# Patient Record
Sex: Male | Born: 1960 | Race: White | Hispanic: No | Marital: Married | Smoking: Current every day smoker
Health system: Southern US, Community
[De-identification: ages and names within clinical notes are randomized; demographics above are authoritative.]

## PROBLEM LIST (undated history)

## (undated) DIAGNOSIS — E119 Type 2 diabetes mellitus without complications: Secondary | ICD-10-CM

## (undated) HISTORY — PX: APPENDECTOMY: SHX54

## (undated) HISTORY — PX: PILONIDAL CYST EXCISION: SHX744

---

## 1999-05-11 ENCOUNTER — Inpatient Hospital Stay (HOSPITAL_COMMUNITY): Admission: EM | Admit: 1999-05-11 | Discharge: 1999-05-13 | Payer: Self-pay | Admitting: Emergency Medicine

## 1999-05-11 ENCOUNTER — Encounter: Payer: Self-pay | Admitting: Emergency Medicine

## 2007-06-14 ENCOUNTER — Ambulatory Visit (HOSPITAL_COMMUNITY): Admission: RE | Admit: 2007-06-14 | Discharge: 2007-06-14 | Payer: Self-pay | Admitting: Family Medicine

## 2008-07-18 IMAGING — US US SCROTUM
1 series · 14 of 25 positions shown · non-contrast
Comparison: none

CLINICAL DATA: Two months of possible painless, right scrotal palpable nodules. 
 SCROTAL ULTRASOUND:
TECHNIQUE: Complete ultrasound examination of the testicles, epididymis, and other scrotal structures was performed.

[Series 1: unknown · 0.07mm/px · 14 of 45 slices shown]
[im 1/45]
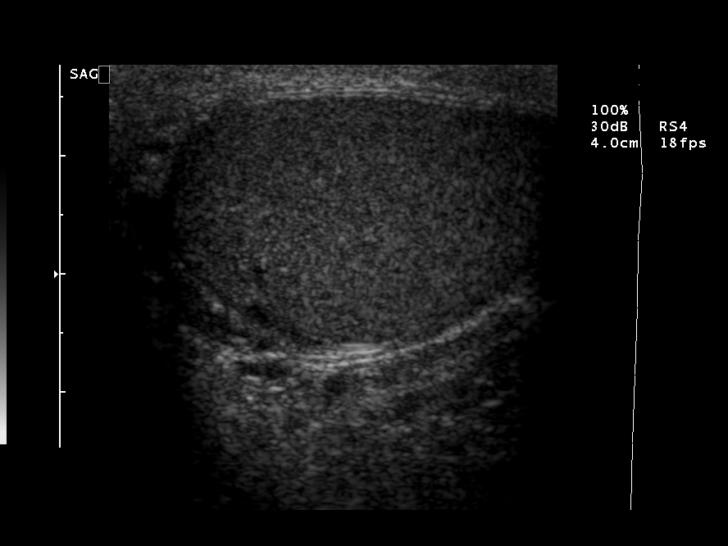
[im 4/45]
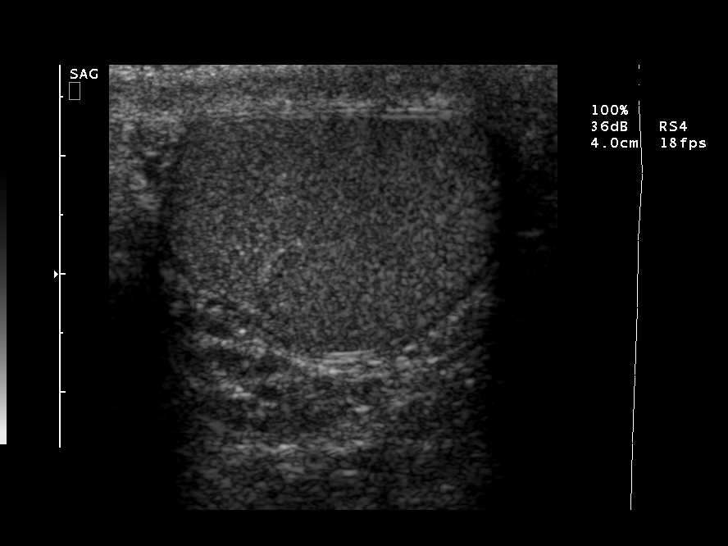
[im 8/45]
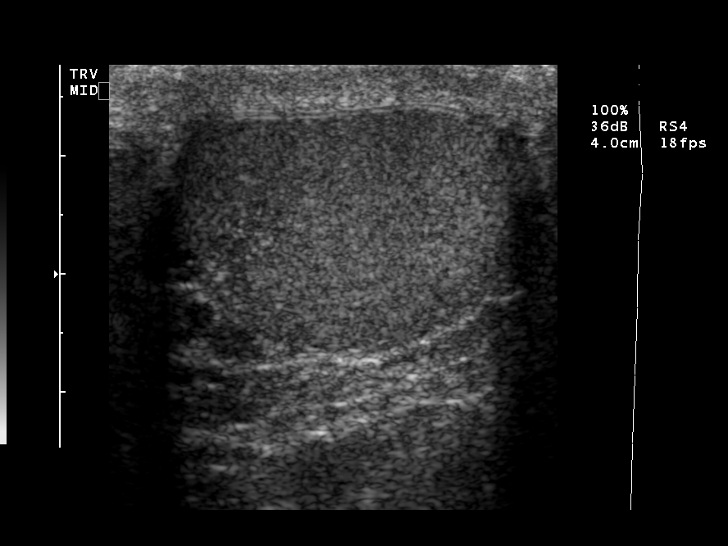
[im 12/45]
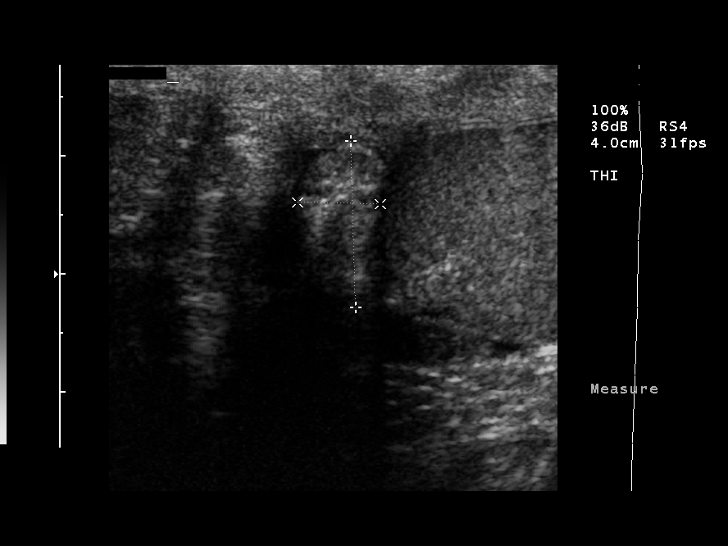
[im 15/45]
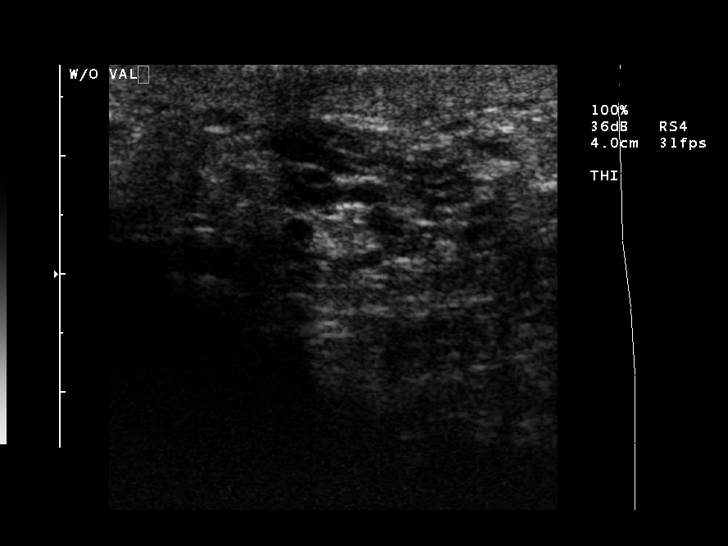
[im 17/45]
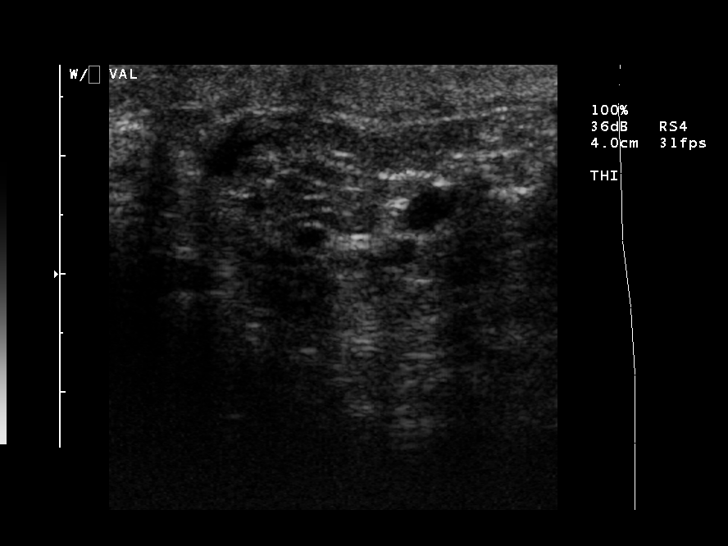
[im 21/45]
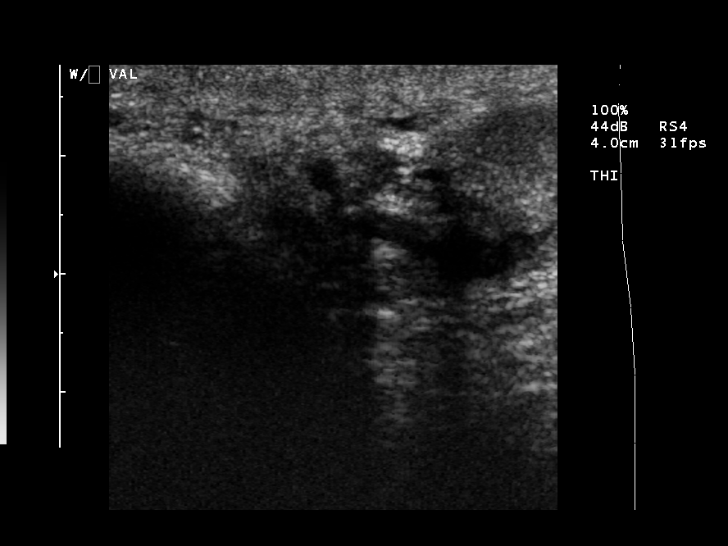
[im 24/45]
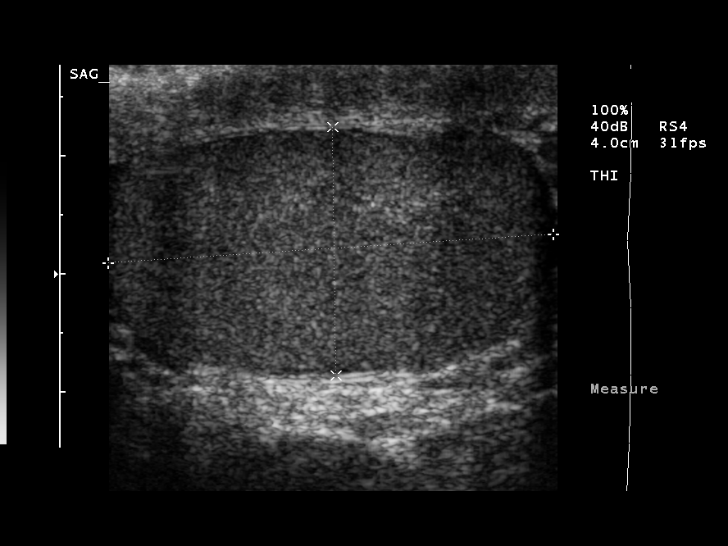
[im 28/45]
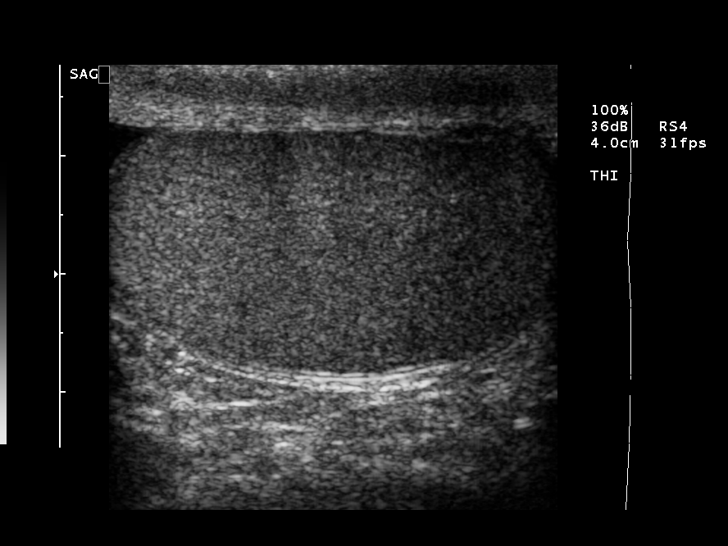
[im 30/45]
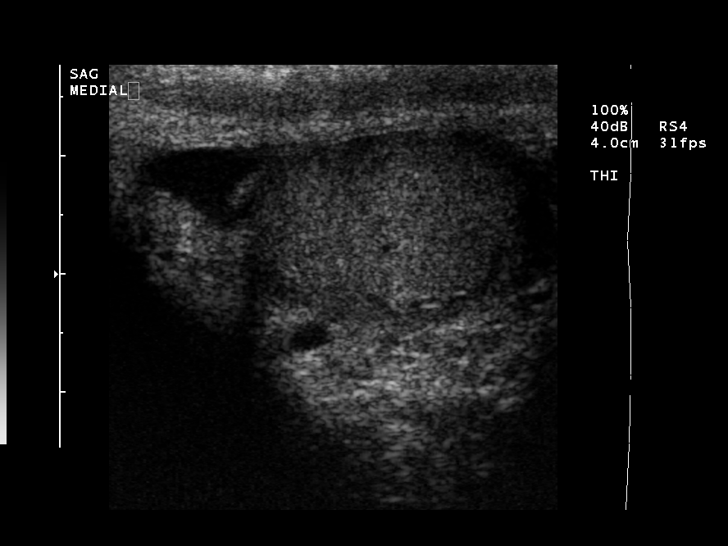
[im 34/45]
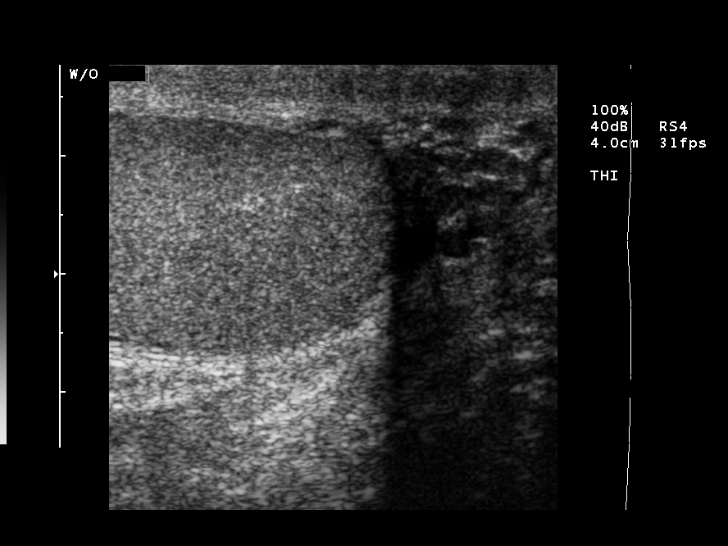
[im 37/45]
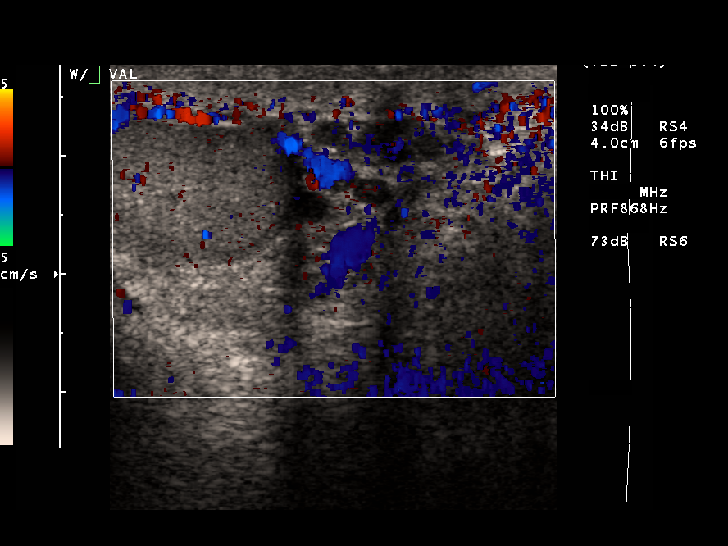
[im 41/45]
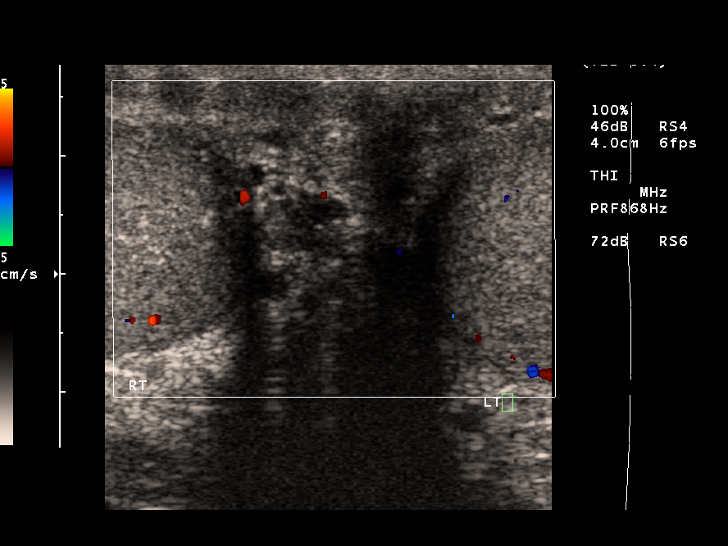
[im 45/45]
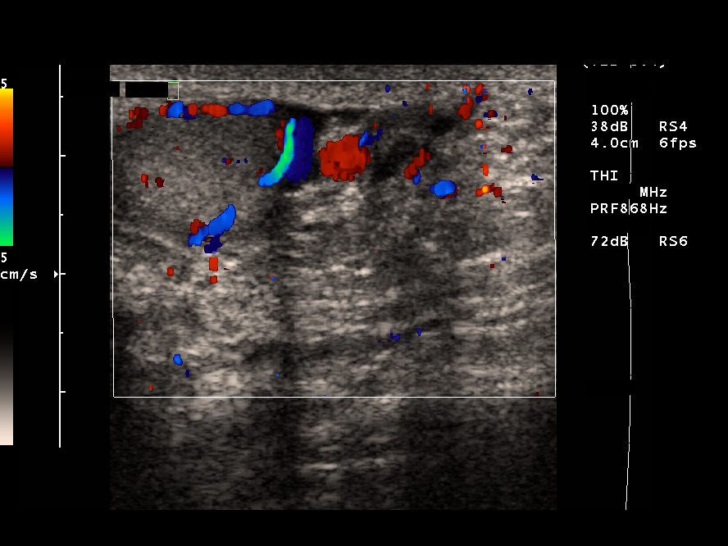

[14 of 25 positions shown; findings below may reference images not displayed]

FINDINGS: Bilateral testes are sonographically normal with the right testis measuring 2.8 cm long x 2 cm AP x 2.8 cm wide and left 3.8 cm long x 2.1 cm AP x 1.8 cm wide.  Bilateral testicular color Doppler flow is symmetrical and normal.  Bilateral epididymal heads are sonographically normal with the right measuring 1.4 cm long x 0.7 cm AP and left 1.3 cm long x 0.8 cm AP.  No hydrocele nor varicocele seen.
IMPRESSION: Negative.

## 2013-01-28 ENCOUNTER — Other Ambulatory Visit: Payer: Self-pay | Admitting: Gastroenterology

## 2017-05-23 DIAGNOSIS — E113299 Type 2 diabetes mellitus with mild nonproliferative diabetic retinopathy without macular edema, unspecified eye: Secondary | ICD-10-CM | POA: Diagnosis not present

## 2017-05-23 DIAGNOSIS — E291 Testicular hypofunction: Secondary | ICD-10-CM | POA: Diagnosis not present

## 2017-05-23 DIAGNOSIS — Z125 Encounter for screening for malignant neoplasm of prostate: Secondary | ICD-10-CM | POA: Diagnosis not present

## 2017-09-12 DIAGNOSIS — E291 Testicular hypofunction: Secondary | ICD-10-CM | POA: Diagnosis not present

## 2017-09-12 DIAGNOSIS — E1165 Type 2 diabetes mellitus with hyperglycemia: Secondary | ICD-10-CM | POA: Diagnosis not present

## 2017-12-13 DIAGNOSIS — E113291 Type 2 diabetes mellitus with mild nonproliferative diabetic retinopathy without macular edema, right eye: Secondary | ICD-10-CM | POA: Diagnosis not present

## 2017-12-13 DIAGNOSIS — E7800 Pure hypercholesterolemia, unspecified: Secondary | ICD-10-CM | POA: Diagnosis not present

## 2017-12-13 DIAGNOSIS — E291 Testicular hypofunction: Secondary | ICD-10-CM | POA: Diagnosis not present

## 2018-05-28 DIAGNOSIS — E11319 Type 2 diabetes mellitus with unspecified diabetic retinopathy without macular edema: Secondary | ICD-10-CM | POA: Diagnosis not present

## 2018-05-28 DIAGNOSIS — Z125 Encounter for screening for malignant neoplasm of prostate: Secondary | ICD-10-CM | POA: Diagnosis not present

## 2018-05-28 DIAGNOSIS — E291 Testicular hypofunction: Secondary | ICD-10-CM | POA: Diagnosis not present

## 2018-11-27 DIAGNOSIS — E113293 Type 2 diabetes mellitus with mild nonproliferative diabetic retinopathy without macular edema, bilateral: Secondary | ICD-10-CM | POA: Diagnosis not present

## 2018-11-27 DIAGNOSIS — E781 Pure hyperglyceridemia: Secondary | ICD-10-CM | POA: Diagnosis not present

## 2018-11-27 DIAGNOSIS — E291 Testicular hypofunction: Secondary | ICD-10-CM | POA: Diagnosis not present

## 2019-02-21 DIAGNOSIS — E119 Type 2 diabetes mellitus without complications: Secondary | ICD-10-CM | POA: Diagnosis not present

## 2023-07-10 ENCOUNTER — Ambulatory Visit (HOSPITAL_BASED_OUTPATIENT_CLINIC_OR_DEPARTMENT_OTHER): Payer: 59 | Admitting: Student

## 2023-07-10 DIAGNOSIS — M25561 Pain in right knee: Secondary | ICD-10-CM | POA: Diagnosis not present

## 2023-07-11 ENCOUNTER — Encounter (HOSPITAL_BASED_OUTPATIENT_CLINIC_OR_DEPARTMENT_OTHER): Payer: Self-pay | Admitting: Student

## 2023-07-11 NOTE — Progress Notes (Signed)
Chief Complaint: Right knee pain     History of Present Illness:    Alvin Hodges is a 62 y.o. male presenting for evaluation today of pain in his right knee.  This began over a month ago when he recalls tweaking his knee while climbing onto a tractor.  Then about 3 weeks ago, he states that he felt something pop in his knee and it has continued to be painful since then.  Pain is located in the medial and posterior knee and is moderate in severity.  Also notes that he has had more frequent popping sensations.  He has been wearing a knee brace as well as taking ibuprofen.  He was seen in orthopedic urgent care a few weeks ago who recommended rest and anti-inflammatories.   Surgical History:   None  PMH/PSH/Family History/Social History/Meds/Allergies:   No past medical history on file. History reviewed. No pertinent surgical history. Social History   Socioeconomic History   Marital status: Married    Spouse name: Not on file   Number of children: Not on file   Years of education: Not on file   Highest education level: Not on file  Occupational History   Not on file  Tobacco Use   Smoking status: Not on file   Smokeless tobacco: Not on file  Substance and Sexual Activity   Alcohol use: Not on file   Drug use: Not on file   Sexual activity: Not on file  Other Topics Concern   Not on file  Social History Narrative   Not on file   Social Determinants of Health   Financial Resource Strain: Not on file  Food Insecurity: Not on file  Transportation Needs: Not on file  Physical Activity: Not on file  Stress: Not on file  Social Connections: Not on file   No family history on file. Not on File No current outpatient medications on file.   No current facility-administered medications for this visit.   No results found.  Review of Systems:   A ROS was performed including pertinent positives and negatives as documented in the  HPI.  Physical Exam :   Constitutional: NAD and appears stated age Neurological: Alert and oriented Psych: Appropriate affect and cooperative There were no vitals taken for this visit.   Comprehensive Musculoskeletal Exam:    Some tenderness noted over the medial joint line.  No significant erythema or effusion present.  Active range of motion from 0 to 110 degrees without crepitus.  No instability with varus or valgus stress.  Positive McMurray's.  Negative Lachman.  Imaging:   Xray review from urgent care on 06/27/23 (right knee 3 views): Mild patellofemoral degenerative changes.  Otherwise negative.    I personally reviewed and interpreted the radiographs.   Assessment:   62 y.o. male with right knee pain.  He has been experiencing some mechanical symptoms and has not gotten significant relief from NSAIDs and RICE therapy.  At this point I do think an MRI is warranted for further evaluation, particularly to evaluate for possible meniscus injury.  I did encourage to continue with these modalities as needed until we can review MRI results and discuss further treatment plan.  Plan :    -Obtain MRI of the right knee and return to clinic to review results with Dr. Steward Drone  I personally saw and evaluated the patient, and participated in the management and treatment plan.  Hazle Nordmann, PA-C Orthopedics  This document was dictated using Conservation officer, historic buildings. A reasonable attempt at proof reading has been made to minimize errors.

## 2023-07-12 NOTE — Addendum Note (Signed)
Addended by: Barbette Or on: 07/12/2023 09:05 AM   Modules accepted: Orders

## 2023-07-18 ENCOUNTER — Ambulatory Visit (INDEPENDENT_AMBULATORY_CARE_PROVIDER_SITE_OTHER): Payer: 59

## 2023-07-18 DIAGNOSIS — M25561 Pain in right knee: Secondary | ICD-10-CM | POA: Diagnosis not present

## 2023-07-20 ENCOUNTER — Ambulatory Visit (HOSPITAL_BASED_OUTPATIENT_CLINIC_OR_DEPARTMENT_OTHER): Payer: 59 | Admitting: Student

## 2023-07-20 DIAGNOSIS — S83241A Other tear of medial meniscus, current injury, right knee, initial encounter: Secondary | ICD-10-CM | POA: Diagnosis not present

## 2023-07-20 NOTE — Progress Notes (Signed)
Chief Complaint: Right knee pain     History of Present Illness:   07/20/23:  Patient presents today for MRI review.  States that he has gotten some relief since last visit.  He is continuing to wear stabilizing knee brace for added support.    07/10/23: Alvin Hodges is a 62 y.o. male presenting for evaluation today of pain in his right knee.  This began over a month ago when he recalls tweaking his knee while climbing onto a tractor.  Then about 3 weeks ago, he states that he felt something pop in his knee and it has continued to be painful since then.  Pain is located in the medial and posterior knee and is moderate in severity.  Also notes that he has had more frequent popping sensations.  He has been wearing a knee brace as well as taking ibuprofen.  He was seen in orthopedic urgent care a few weeks ago who recommended rest and anti-inflammatories.   Surgical History:   None  PMH/PSH/Family History/Social History/Meds/Allergies:   No past medical history on file. No past surgical history on file. Social History   Socioeconomic History   Marital status: Married    Spouse name: Not on file   Number of children: Not on file   Years of education: Not on file   Highest education level: Not on file  Occupational History   Not on file  Tobacco Use   Smoking status: Not on file   Smokeless tobacco: Not on file  Substance and Sexual Activity   Alcohol use: Not on file   Drug use: Not on file   Sexual activity: Not on file  Other Topics Concern   Not on file  Social History Narrative   Not on file   Social Determinants of Health   Financial Resource Strain: Not on file  Food Insecurity: Not on file  Transportation Needs: Not on file  Physical Activity: Not on file  Stress: Not on file  Social Connections: Not on file   No family history on file. Not on File No current outpatient medications on file.   No current facility-administered  medications for this visit.   No results found.  Review of Systems:   A ROS was performed including pertinent positives and negatives as documented in the HPI.  Physical Exam :   Constitutional: NAD and appears stated age Neurological: Alert and oriented Psych: Appropriate affect and cooperative There were no vitals taken for this visit.   Comprehensive Musculoskeletal Exam:    Some tenderness noted over the medial joint line.  No significant erythema or effusion present.  Active range of motion from 0 to 110 degrees without crepitus.  No instability with varus or valgus stress.  Positive McMurray's.  Negative Lachman.  Imaging:   MRI  (right knee): Tear of the posterior medial meniscus involving the meniscal root.    I personally reviewed and interpreted the radiographs.   Assessment:   62 y.o. male with right knee pain after an injury approximately 2 months ago.  MRI reviewed today does show a tear of the posterior horn of the medial meniscus extending into the root.  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  Patient understands  that if not addressed, meniscal root tears can quickly progress osteoarthritis in the knee.  After discussion of knee arthroscopy procedure and recovery protocols, he would like to proceed with surgical correction.  Recommend continuation of knee brace and pain medications as needed in the meantime.  Plan :    -Plan for right knee arthroscopy with meniscal repair with Dr. Steward Drone     I personally saw and evaluated the patient, and participated in the management and treatment plan.  Hazle Nordmann, PA-C Orthopedics  This document was dictated using Conservation officer, historic buildings. A reasonable attempt at proof reading has been made to minimize errors.

## 2023-08-01 ENCOUNTER — Other Ambulatory Visit (HOSPITAL_BASED_OUTPATIENT_CLINIC_OR_DEPARTMENT_OTHER): Payer: Self-pay | Admitting: Orthopaedic Surgery

## 2023-08-01 DIAGNOSIS — S83241A Other tear of medial meniscus, current injury, right knee, initial encounter: Secondary | ICD-10-CM

## 2023-08-22 ENCOUNTER — Encounter (HOSPITAL_BASED_OUTPATIENT_CLINIC_OR_DEPARTMENT_OTHER): Payer: Self-pay | Admitting: Orthopaedic Surgery

## 2023-08-22 ENCOUNTER — Other Ambulatory Visit: Payer: Self-pay

## 2023-08-22 NOTE — Progress Notes (Signed)
Updated labs in Care Everywhere 07/27/23. Ok by Dr. Malen Gauze to not repeat labs

## 2023-08-29 ENCOUNTER — Encounter (HOSPITAL_BASED_OUTPATIENT_CLINIC_OR_DEPARTMENT_OTHER)
Admission: RE | Disposition: A | Discharge status: Home / Self Care | Payer: Self-pay | Source: Home / Self Care | Attending: Orthopaedic Surgery

## 2023-08-29 ENCOUNTER — Encounter (HOSPITAL_BASED_OUTPATIENT_CLINIC_OR_DEPARTMENT_OTHER): Payer: Self-pay | Admitting: Orthopaedic Surgery

## 2023-08-29 ENCOUNTER — Ambulatory Visit (HOSPITAL_BASED_OUTPATIENT_CLINIC_OR_DEPARTMENT_OTHER): Payer: 59 | Admitting: Certified Registered"

## 2023-08-29 ENCOUNTER — Other Ambulatory Visit: Payer: Self-pay

## 2023-08-29 ENCOUNTER — Ambulatory Visit (HOSPITAL_BASED_OUTPATIENT_CLINIC_OR_DEPARTMENT_OTHER)
Admission: RE | Admit: 2023-08-29 | Discharge: 2023-08-29 | Disposition: A | Discharge status: Home / Self Care | Payer: 59 | Attending: Orthopaedic Surgery | Admitting: Orthopaedic Surgery

## 2023-08-29 DIAGNOSIS — S83241A Other tear of medial meniscus, current injury, right knee, initial encounter: Secondary | ICD-10-CM | POA: Insufficient documentation

## 2023-08-29 DIAGNOSIS — E119 Type 2 diabetes mellitus without complications: Secondary | ICD-10-CM | POA: Diagnosis not present

## 2023-08-29 DIAGNOSIS — Y9339 Activity, other involving climbing, rappelling and jumping off: Secondary | ICD-10-CM | POA: Diagnosis not present

## 2023-08-29 DIAGNOSIS — F17200 Nicotine dependence, unspecified, uncomplicated: Secondary | ICD-10-CM | POA: Diagnosis not present

## 2023-08-29 HISTORY — DX: Type 2 diabetes mellitus without complications: E11.9

## 2023-08-29 HISTORY — PX: KNEE ARTHROSCOPY WITH MENISCAL REPAIR: SHX5653

## 2023-08-29 LAB — GLUCOSE, CAPILLARY
Glucose-Capillary: 124 mg/dL — ABNORMAL HIGH (ref 70–99)
Glucose-Capillary: 136 mg/dL — ABNORMAL HIGH (ref 70–99)

## 2023-08-29 SURGERY — ARTHROSCOPY, KNEE, WITH MENISCUS REPAIR
Anesthesia: General | Site: Knee | Laterality: Right

## 2023-08-29 MED ORDER — ROPIVACAINE HCL 7.5 MG/ML IJ SOLN
INTRAMUSCULAR | Status: DC | PRN
Start: 2023-08-29 — End: 2023-08-29
  Administered 2023-08-29: 20 mL via PERINEURAL

## 2023-08-29 MED ORDER — LIDOCAINE 2% (20 MG/ML) 5 ML SYRINGE
INTRAMUSCULAR | Status: AC
  Filled 2023-08-29: qty 5

## 2023-08-29 MED ORDER — PROPOFOL 10 MG/ML IV BOLUS
INTRAVENOUS | Status: AC
  Filled 2023-08-29: qty 20

## 2023-08-29 MED ORDER — CEFAZOLIN SODIUM-DEXTROSE 2-3 GM-%(50ML) IV SOLR
INTRAVENOUS | Status: DC | PRN
Start: 2023-08-29 — End: 2023-08-29
  Administered 2023-08-29: 2 g via INTRAVENOUS

## 2023-08-29 MED ORDER — MIDAZOLAM HCL 2 MG/2ML IJ SOLN
INTRAMUSCULAR | Status: AC
  Filled 2023-08-29: qty 2

## 2023-08-29 MED ORDER — FENTANYL CITRATE (PF) 100 MCG/2ML IJ SOLN
INTRAMUSCULAR | Status: AC
  Filled 2023-08-29: qty 2

## 2023-08-29 MED ORDER — PROPOFOL 10 MG/ML IV BOLUS
INTRAVENOUS | Status: DC | PRN
  Administered 2023-08-29: 200 mg via INTRAVENOUS

## 2023-08-29 MED ORDER — FENTANYL CITRATE (PF) 100 MCG/2ML IJ SOLN
50.0000 ug | Freq: Once | INTRAMUSCULAR | Status: AC
  Administered 2023-08-29: 50 ug via INTRAVENOUS

## 2023-08-29 MED ORDER — ONDANSETRON HCL 4 MG/2ML IJ SOLN
INTRAMUSCULAR | Status: AC
  Filled 2023-08-29: qty 2

## 2023-08-29 MED ORDER — ONDANSETRON HCL 4 MG/2ML IJ SOLN
INTRAMUSCULAR | Status: DC | PRN
  Administered 2023-08-29: 4 mg via INTRAVENOUS

## 2023-08-29 MED ORDER — LIDOCAINE HCL (CARDIAC) PF 100 MG/5ML IV SOSY
PREFILLED_SYRINGE | INTRAVENOUS | Status: DC | PRN
  Administered 2023-08-29: 100 mg via INTRAVENOUS

## 2023-08-29 MED ORDER — DEXAMETHASONE SODIUM PHOSPHATE 10 MG/ML IJ SOLN
INTRAMUSCULAR | Status: AC
  Filled 2023-08-29: qty 1

## 2023-08-29 MED ORDER — LACTATED RINGERS IV SOLN: INTRAVENOUS | Status: DC

## 2023-08-29 MED ORDER — ACETAMINOPHEN 500 MG PO TABS: 500.0000 mg | ORAL_TABLET | Freq: Three times a day (TID) | ORAL | 0 refills | Status: AC

## 2023-08-29 MED ORDER — IBUPROFEN 800 MG PO TABS: 800.0000 mg | ORAL_TABLET | Freq: Three times a day (TID) | ORAL | 0 refills | Status: AC

## 2023-08-29 MED ORDER — DEXAMETHASONE SODIUM PHOSPHATE 10 MG/ML IJ SOLN
INTRAMUSCULAR | Status: DC | PRN
  Administered 2023-08-29: 4 mg via INTRAVENOUS

## 2023-08-29 MED ORDER — MIDAZOLAM HCL 2 MG/2ML IJ SOLN
1.0000 mg | Freq: Once | INTRAMUSCULAR | Status: AC
  Administered 2023-08-29: 1 mg via INTRAVENOUS

## 2023-08-29 MED ORDER — CEFAZOLIN SODIUM 1 G IJ SOLR
INTRAMUSCULAR | Status: AC
  Filled 2023-08-29: qty 20

## 2023-08-29 MED ORDER — SODIUM CHLORIDE 0.9 % IR SOLN
Status: DC | PRN
  Administered 2023-08-29: 9000 mL

## 2023-08-29 MED ORDER — ASPIRIN 325 MG PO TBEC: 325.0000 mg | DELAYED_RELEASE_TABLET | Freq: Every day | ORAL | 0 refills | Status: AC

## 2023-08-29 MED ORDER — SODIUM CHLORIDE (PF) 0.9 % IJ SOLN
INTRAMUSCULAR | Status: AC
  Filled 2023-08-29: qty 20

## 2023-08-29 MED ORDER — OXYCODONE HCL 5 MG PO TABS
5.0000 mg | ORAL_TABLET | ORAL | 0 refills | Status: AC | PRN
Start: 2023-08-29 — End: ?

## 2023-08-29 MED ORDER — FENTANYL CITRATE (PF) 100 MCG/2ML IJ SOLN
INTRAMUSCULAR | Status: DC | PRN
  Administered 2023-08-29 (×6): 25 ug via INTRAVENOUS

## 2023-08-29 SURGICAL SUPPLY — 63 items
APL PRP STRL LF DISP 70% ISPRP (MISCELLANEOUS) ×1
BANDAGE ESMARK 6X9 LF (GAUZE/BANDAGES/DRESSINGS) IMPLANT
BLADE EXCALIBUR 4.0X13 (MISCELLANEOUS) IMPLANT
BLADE SURG 15 STRL LF DISP TIS (BLADE) IMPLANT
BLADE SURG 15 STRL SS (BLADE) ×1
BNDG CMPR 5X4 KNIT ELC UNQ LF (GAUZE/BANDAGES/DRESSINGS) ×1
BNDG CMPR 6 X 5 YARDS HK CLSR (GAUZE/BANDAGES/DRESSINGS) ×1
BNDG CMPR 9X6 STRL LF SNTH (GAUZE/BANDAGES/DRESSINGS)
BNDG ELASTIC 4INX 5YD STR LF (GAUZE/BANDAGES/DRESSINGS) ×2 IMPLANT
BNDG ELASTIC 6INX 5YD STR LF (GAUZE/BANDAGES/DRESSINGS) ×2 IMPLANT
BNDG ESMARK 6X9 LF (GAUZE/BANDAGES/DRESSINGS)
CHLORAPREP W/TINT 26 (MISCELLANEOUS) ×2 IMPLANT
COOLER ICEMAN CLASSIC (MISCELLANEOUS) ×2 IMPLANT
CUFF TOURN SGL QUICK 34 (TOURNIQUET CUFF)
CUFF TRNQT CYL 34X4.125X (TOURNIQUET CUFF) IMPLANT
DISSECTOR 3.8MM X 13CM (MISCELLANEOUS) ×2 IMPLANT
DRAPE ARTHROSCOPY W/POUCH 90 (DRAPES) ×2 IMPLANT
DRAPE IMP U-DRAPE 54X76 (DRAPES) ×2 IMPLANT
DRAPE INCISE IOBAN 66X45 STRL (DRAPES) IMPLANT
DRAPE U-SHAPE 47X51 STRL (DRAPES) ×2 IMPLANT
DW OUTFLOW CASSETTE/TUBE SET (MISCELLANEOUS) ×2 IMPLANT
ELECT REM PT RETURN 9FT ADLT (ELECTROSURGICAL) ×1
ELECTRODE REM PT RTRN 9FT ADLT (ELECTROSURGICAL) IMPLANT
EXCALIBUR 3.8MM X 13CM (MISCELLANEOUS) IMPLANT
GAUZE 4X4 16PLY ~~LOC~~+RFID DBL (SPONGE) IMPLANT
GAUZE PAD ABD 8X10 STRL (GAUZE/BANDAGES/DRESSINGS) ×2 IMPLANT
GAUZE SPONGE 4X4 12PLY STRL (GAUZE/BANDAGES/DRESSINGS) ×2 IMPLANT
GAUZE XEROFORM 1X8 LF (GAUZE/BANDAGES/DRESSINGS) ×2 IMPLANT
GLOVE BIO SURGEON STRL SZ 6 (GLOVE) ×2 IMPLANT
GLOVE BIO SURGEON STRL SZ7.5 (GLOVE) ×2 IMPLANT
GLOVE BIOGEL PI IND STRL 6.5 (GLOVE) ×2 IMPLANT
GLOVE BIOGEL PI IND STRL 8 (GLOVE) ×2 IMPLANT
GOWN STRL REUS W/ TWL LRG LVL3 (GOWN DISPOSABLE) ×2 IMPLANT
GOWN STRL REUS W/ TWL XL LVL3 (GOWN DISPOSABLE) ×2 IMPLANT
GOWN STRL REUS W/TWL LRG LVL3 (GOWN DISPOSABLE) ×1
GOWN STRL REUS W/TWL XL LVL3 (GOWN DISPOSABLE) ×2
IMMOBILIZER KNEE 24 THIGH 36 (MISCELLANEOUS) IMPLANT
IMMOBILIZER KNEE 24 UNIV (MISCELLANEOUS) ×1
KIT ROOT REPAIR MEINISCAL PEEK (Anchor) IMPLANT
LOOP 2 FIBERLINK CLOSED (SUTURE) IMPLANT
MANIFOLD NEPTUNE II (INSTRUMENTS) ×2 IMPLANT
MEINISCAL ROOT REPAIR KIT PEEK (Anchor) ×1 IMPLANT
NDL HYPO 18GX1.5 BLUNT FILL (NEEDLE) ×2 IMPLANT
NDL SAFETY ECLIP 18X1.5 (MISCELLANEOUS) ×2 IMPLANT
NDL SUT 2-0 SCORPION KNEE (NEEDLE) IMPLANT
NEEDLE HYPO 18GX1.5 BLUNT FILL (NEEDLE) ×1 IMPLANT
NEEDLE SUT 2-0 SCORPION KNEE (NEEDLE) IMPLANT
PACK ARTHROSCOPY DSU (CUSTOM PROCEDURE TRAY) ×2 IMPLANT
PACK BASIN DAY SURGERY FS (CUSTOM PROCEDURE TRAY) ×2 IMPLANT
PAD COLD SHLDR WRAP-ON (PAD) ×2 IMPLANT
PADDING CAST COTTON 6X4 STRL (CAST SUPPLIES) ×2 IMPLANT
PENCIL SMOKE EVACUATOR (MISCELLANEOUS) IMPLANT
PORT APPOLLO RF 90DEGREE MULTI (SURGICAL WAND) ×2 IMPLANT
SLEEVE SCD COMPRESS KNEE MED (STOCKING) ×2 IMPLANT
SUCTION TUBE FRAZIER 10FR DISP (SUCTIONS) IMPLANT
SUT ETHILON 3 0 PS 1 (SUTURE) ×2 IMPLANT
SUT VIC AB 2-0 SH 27 (SUTURE) ×1
SUT VIC AB 2-0 SH 27XBRD (SUTURE) IMPLANT
SUT VIC AB 3-0 FS2 27 (SUTURE) IMPLANT
SYR 5ML LL (SYRINGE) ×2 IMPLANT
TOWEL GREEN STERILE FF (TOWEL DISPOSABLE) ×4 IMPLANT
TRAY DSU PREP LF (CUSTOM PROCEDURE TRAY) ×2 IMPLANT
TUBING ARTHROSCOPY IRRIG 16FT (MISCELLANEOUS) ×2 IMPLANT

## 2023-08-29 NOTE — Anesthesia Preprocedure Evaluation (Signed)
Anesthesia Evaluation  Patient identified by MRN, date of birth, ID band Patient awake    Reviewed: Allergy & Precautions, H&P , NPO status , Patient's Chart, lab work & pertinent test results  Airway Mallampati: II  TM Distance: >3 FB Neck ROM: Full    Dental no notable dental hx.    Pulmonary Current Smoker and Patient abstained from smoking.   Pulmonary exam normal breath sounds clear to auscultation       Cardiovascular negative cardio ROS Normal cardiovascular exam Rhythm:Regular Rate:Normal     Neuro/Psych negative neurological ROS  negative psych ROS   GI/Hepatic negative GI ROS, Neg liver ROS,,,  Endo/Other  diabetes, Well Controlled, Type 2    Renal/GU negative Renal ROS  negative genitourinary   Musculoskeletal negative musculoskeletal ROS (+)    Abdominal   Peds negative pediatric ROS (+)  Hematology negative hematology ROS (+)   Anesthesia Other Findings   Reproductive/Obstetrics negative OB ROS                             Anesthesia Physical Anesthesia Plan  ASA: 2  Anesthesia Plan: General   Post-op Pain Management: Toradol IV (intra-op)*   Induction: Intravenous  PONV Risk Score and Plan: 1 and Ondansetron, Dexamethasone and Treatment may vary due to age or medical condition  Airway Management Planned: LMA  Additional Equipment:   Intra-op Plan:   Post-operative Plan: Extubation in OR  Informed Consent: I have reviewed the patients History and Physical, chart, labs and discussed the procedure including the risks, benefits and alternatives for the proposed anesthesia with the patient or authorized representative who has indicated his/her understanding and acceptance.     Dental advisory given  Plan Discussed with: CRNA and Surgeon  Anesthesia Plan Comments:        Anesthesia Quick Evaluation

## 2023-08-29 NOTE — Brief Op Note (Signed)
   Brief Op Note  Date of Surgery: 08/29/2023  Preoperative Diagnosis: RIGHT KNEE MEDIAL MENISCUS TEAR  Postoperative Diagnosis: same  Procedure: Procedure(s): RIGHT KNEE ARTHROSCOPY WITH MEDIAL  MENISCAL REPAIR  Implants: Implant Name Type Inv. Item Serial No. Manufacturer Lot No. LRB No. Used Action  MEINISCAL ROOT REPAIR KIT PEEK - [id number] Anchor MEINISCAL ROOT REPAIR KIT PEEK  [employer] [id number] Right 1 Implanted    Surgeons: Surgeon(s): Huel Cote, MD  Anesthesia: General    Estimated Blood Loss: See anesthesia record  Complications: None  Condition to PACU: Stable  Benancio Deeds, MD 08/29/2023 12:27 PM

## 2023-08-29 NOTE — Op Note (Signed)
Date of Surgery: 08/29/2023  INDICATIONS: Mr. Birtcher is a 62 y.o.-year-old male with right medial meniscal root tear.  The risk and benefits of the procedure were discussed in detail and documented in the pre-operative evaluation.   PREOPERATIVE DIAGNOSIS: 1. Right knee medial meniscal root tear  POSTOPERATIVE DIAGNOSIS: Same.  PROCEDURE: 1. Right knee medial meniscal root repair  SURGEON: Benancio Deeds MD  ASSISTANT: Kerby Less, ATC  ANESTHESIA:  general  IV FLUIDS AND URINE: See anesthesia record.  ANTIBIOTICS: Ancef  ESTIMATED BLOOD LOSS: 15 mL.  IMPLANTS:  Implant Name Type Inv. Item Serial No. Manufacturer Lot No. LRB No. Used Action  MEINISCAL ROOT REPAIR KIT PEEK - [id number] Anchor MEINISCAL ROOT REPAIR KIT PEEK  [employer] [id number] Right 1 Implanted    DRAINS: None  CULTURES: None  COMPLICATIONS: none  DESCRIPTION OF PROCEDURE:  Examination under anesthesia: A careful examination under anesthesia was performed.  Knee ROM motion was: -3 - 135 Lachman: Normal Pivot Shift: Normal Posterior drawer: normal.   Varus stability in full extension: normal.   Varus stability in 30 degrees of flexion: normal.  Valgus stability in full extension: normal.   Valgus stability in 30 degrees of flexion: normal.  Posterolateral drawer: normal   Intra-operative findings: A thorough arthroscopic examination of the knee was performed.  The findings are: 1. Suprapatellar pouch: Normal 2. Undersurface of median ridge: Normal 3. Medial patellar facet: Normal 4. Lateral patellar facet: Normal 5. Trochlea: Normal 6. Lateral gutter/popliteus tendon: Normal 7. Hoffa's fat pad: Normal 8. Medial gutter/plica: Normal 9. ACL: Normal 10. PCL: Normal 11. Medial meniscus: Meniscal root tear, complete 12. Medial compartment cartilage: Grade 2 cartilage fissuring diffusely tibia/femur 13. Lateral meniscus: Normal 14. Lateral compartment cartilage: Normal  I identified the  patient in the pre-operative holding area.  I marked the operative knee with my initials. I reviewed the risks and benefits of the proposed surgical intervention and the patient wished to proceed.  Anesthesia performed a peripheral nerve block.  Patient was subsequently taken back to the operating room.  The patient was transferred to the operative suite and placed in the supine position with all bony prominences padded.     SCDs were placed on the non-operative lower extremity. Appropriate antibiotics was administered within 1 hour before incision. The operative lower extremity was then prepped and draped in standard fashion. A time out was performed confirming the correct extremity, correct patient and correct procedure.    A standard anterolateral portal was made with an 11 blade.  The ideal position for the anteromedial portal was established using a spinal needle.  This AM portal was then created under direct visualization with an 11 blade.  A full diagnostic arthroscopy was then performed, as described above, including probing of the chondral and meniscal surfaces.     The meniscus root repair was then performed.  A knee scorpion from [employer] was then used to place two 0 non-absorbable sutures through the torn posterior horn meniscus 1.5 cm medial to the posterior root. The 2 free ends of the suture were passed through the loop, creating a locking stitch/luggage tag.   A tibial tunnel was created at the media meniscal root attachment using an ACL guide. Once the guide was positioned intra-articularly at the center of the meniscal root, the [employer] retrocutter was introduced up through the footprint of the root.  The sleeve was malleted into place and a fiber stick was then placed.  The sutures were retrieved through the medial  portal.  These were used to pass the sutures in the root.  Care was taken to ensure that no suture bridge had formed.  The sutures of the meniscal root were then inserted into  the tibia using a 4.75 mm swivel lock.  This was done by first drilling into the tibial bone and subsequently tapping the bone.  The sutures were tensioned and the anchor was placed.  Arthroscopy confirmed anatomic reduction of the root of the meniscus with good tension.  That concluded the case.  Skin was closed with 2-0 Vicryl and 3-0 nylon. Xeroform gauze, gauze, Tegaderm, Iceman and brace were applied.  Instrument, sponge, and needle counts were correct prior to wound closure and at the conclusion of the case.  The patient was taken to the PACU without complication     POSTOPERATIVE PLAN: He will be touchdown weightbearing for 2 weeks. He will be seen by PT postop. He will be transitioned from knee immobilizer to hinged brace at first PT apptointment  Benancio Deeds, MD 12:28 PM

## 2023-08-29 NOTE — Interval H&P Note (Signed)
History and Physical Interval Note:  08/29/2023 10:38 AM  Alvin Hodges  has presented today for surgery, with the diagnosis of RIGHT KNEE MEDIAL MENISCUS TEAR.  The various methods of treatment have been discussed with the patient and family. After consideration of risks, benefits and other options for treatment, the patient has consented to  Procedure(s): RIGHT KNEE ARTHROSCOPY WITH MEDIAL  MENISCAL REPAIR (Right) as a surgical intervention.  The patient's history has been reviewed, patient examined, no change in status, stable for surgery.  I have reviewed the patient's chart and labs.  Questions were answered to the patient's satisfaction.     Huel Cote

## 2023-08-29 NOTE — H&P (Signed)
Chief Complaint: Right knee pain        History of Present Illness:    07/20/23:  Patient presents today for MRI review.  States that he has gotten some relief since last visit.  He is continuing to wear stabilizing knee brace for added support.       07/10/23: Alvin Hodges is a 62 y.o. male presenting for evaluation today of pain in his right knee.  This began over a month ago when he recalls tweaking his knee while climbing onto a tractor.  Then about 3 weeks ago, he states that he felt something pop in his knee and it has continued to be painful since then.  Pain is located in the medial and posterior knee and is moderate in severity.  Also notes that he has had more frequent popping sensations.  He has been wearing a knee brace as well as taking ibuprofen.  He was seen in orthopedic urgent care a few weeks ago who recommended rest and anti-inflammatories.     Surgical History:   None   PMH/PSH/Family History/Social History/Meds/Allergies:   No past medical history on file.     No past surgical history on file.     Social History         Socioeconomic History   Marital status: Married      Spouse name: Not on file   Number of children: Not on file   Years of education: Not on file   Highest education level: Not on file  Occupational History   Not on file  Tobacco Use   Smoking status: Not on file   Smokeless tobacco: Not on file  Substance and Sexual Activity   Alcohol use: Not on file   Drug use: Not on file   Sexual activity: Not on file  Other Topics Concern   Not on file  Social History Narrative   Not on file    Social Determinants of Health    Financial Resource Strain: Not on file  Food Insecurity: Not on file  Transportation Needs: Not on file  Physical Activity: Not on file  Stress: Not on file  Social Connections: Not on file    No family history on file.     Allergies  Not on File   No current outpatient  medications on file.      No current facility-administered medications for this visit.      Imaging Results (Last 48 hours)  No results found.     Review of Systems:   A ROS was performed including pertinent positives and negatives as documented in the HPI.   Physical Exam :   Constitutional: NAD and appears stated age Neurological: Alert and oriented Psych: Appropriate affect and cooperative There were no vitals taken for this visit.    Comprehensive Musculoskeletal Exam:     Some tenderness noted over the medial joint line.  No significant erythema or effusion present.  Active range of motion from 0 to 110 degrees without crepitus.  No instability with varus or valgus stress.  Positive McMurray's.  Negative Lachman.   Imaging:   MRI  (right knee): Tear of the posterior medial meniscus involving the meniscal root.       I personally reviewed and interpreted the radiographs.     Assessment:  62 y.o. male with right knee pain after an injury approximately 2 months ago.  MRI reviewed today does show a tear of the posterior horn of the medial meniscus extending into the root.  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  Patient understands that if not addressed, meniscal root tears can quickly progress osteoarthritis in the knee.  After discussion of knee arthroscopy procedure and recovery protocols, he would like to proceed with surgical correction.  Recommend continuation of knee brace and pain medications as needed in the meantime.   Plan :     -Plan for right knee arthroscopy with meniscal root repair   After a lengthy discussion of treatment options, including risks, benefits, alternatives, complications of surgical and nonsurgical conservative options, the patient elected surgical repair.   The patient  is aware of the material risks  and complications including, but not limited to injury to  adjacent structures, neurovascular injury, infection, numbness, bleeding, implant failure, thermal burns, stiffness, persistent pain, failure to heal, disease transmission from allograft, need for further surgery, dislocation, anesthetic risks, blood clots, risks of death,and others. The probabilities of surgical success and failure discussed with patient given their particular co-morbidities.The time and nature of expected rehabilitation and recovery was discussed.The patient's questions were all answered preoperatively.  No barriers to understanding were noted. I explained the natural history of the disease process and Rx rationale.  I explained to the patient what I considered to be reasonable expectations given their personal situation.  The final treatment plan was arrived at through a shared patient decision making process model.          I personally saw and evaluated the patient, and participated in the management and treatment plan.

## 2023-08-29 NOTE — Progress Notes (Signed)
Assisted Dr. Rose with right, adductor canal, ultrasound guided block. Side rails up, monitors on throughout procedure. See vital signs in flow sheet. Tolerated Procedure well. 

## 2023-08-29 NOTE — Anesthesia Procedure Notes (Signed)
Anesthesia Regional Block: Adductor canal block   Pre-Anesthetic Checklist: , timeout performed,  Correct Patient, Correct Site, Correct Laterality,  Correct Procedure, Correct Position, site marked,  Risks and benefits discussed,  Surgical consent,  Pre-op evaluation,  At surgeon's request and post-op pain management  Laterality: Right  Prep: chloraprep       Needles:  Injection technique: Single-shot  Needle Type: Echogenic Needle     Needle Length: 9cm      Additional Needles:   Procedures:,,,, ultrasound used (permanent image in chart),,    Narrative:  Start time: 08/29/2023 10:47 AM End time: 08/29/2023 10:55 AM Injection made incrementally with aspirations every 5 mL.  Performed by: Personally  Anesthesiologist: Eilene Ghazi, MD  Additional Notes: Patient tolerated the procedure well without complications

## 2023-08-29 NOTE — Transfer of Care (Addendum)
Immediate Anesthesia Transfer of Care Note  Patient: Alvin Hodges  Procedure(s) Performed: RIGHT KNEE ARTHROSCOPY WITH MEDIAL  MENISCAL REPAIR (Right: Knee)  Patient Location: PACU  Anesthesia Type:GA combined with regional for post-op pain  Level of Consciousness: drowsy  Airway & Oxygen Therapy: Patient Spontanous Breathing and Patient connected to face mask oxygen  Post-op Assessment: Report given to RN and Post -op Vital signs reviewed and stable  Post vital signs: Reviewed and stable  Last Vitals:  Vitals Value Taken Time  BP 131/81 08/29/23 1245  Temp    Pulse 83 08/29/23 1246  Resp 20 08/29/23 1246  SpO2 97 % 08/29/23 1246  Vitals shown include unfiled device data.  Last Pain:  Vitals:   08/29/23 0928  TempSrc: Temporal  PainSc: 0-No pain         Complications: No notable events documented.

## 2023-08-29 NOTE — Anesthesia Procedure Notes (Signed)
Procedure Name: LMA Insertion Date/Time: 08/29/2023 11:08 AM  Performed by: Lauralyn Primes, CRNAPre-anesthesia Checklist: Patient identified, Emergency Drugs available, Suction available and Patient being monitored Patient Re-evaluated:Patient Re-evaluated prior to induction Oxygen Delivery Method: Circle system utilized Preoxygenation: Pre-oxygenation with 100% oxygen Induction Type: IV induction Ventilation: Mask ventilation without difficulty LMA: LMA inserted LMA Size: 5.0 Number of attempts: 1 Airway Equipment and Method: Bite block Placement Confirmation: positive ETCO2 Tube secured with: Tape Dental Injury: Teeth and Oropharynx as per pre-operative assessment

## 2023-08-29 NOTE — Discharge Instructions (Addendum)
Discharge Instructions    Attending Surgeon: Huel Cote, MD Office Phone Number: [phone]   Diagnosis and Procedures:    Surgeries Performed: Right knee meniscal root repair  Discharge Plan:    Diet: Resume usual diet. Begin with light or bland foods.  Drink plenty of fluids.  Activity:  Touch down weight bearing right leg in knee immobilizer. You are advised to go home directly from the hospital or surgical center. Restrict your activities.  GENERAL INSTRUCTIONS: 1.  Please apply ice to your wound to help with swelling and inflammation. This will improve your comfort and your overall recovery following surgery.     2. Please call Dr. Serena Croissant office at [phone] with questions Monday-Friday during business hours. If no one answers, please leave a message and someone should get back to the patient within 24 hours. For emergencies please call 911 or proceed to the emergency room.   3. Patient to notify surgical team if experiences any of the following: Bowel/Bladder dysfunction, uncontrolled pain, nerve/muscle weakness, incision with increased drainage or redness, nausea/vomiting and Fever greater than 101.0 F.  Be alert for signs of infection including redness, streaking, odor, fever or chills. Be alert for excessive pain or bleeding and notify your surgeon immediately.  WOUND INSTRUCTIONS:   Leave your dressing, cast, or splint in place until your post operative visit.  Keep it clean and dry.  Always keep the incision clean and dry until the staples/sutures are removed. If there is no drainage from the incision you should keep it open to air. If there is drainage from the incision you must keep it covered at all times until the drainage stops  Do not soak in a bath tub, hot tub, pool, lake or other body of water until 21 days after your surgery and your incision is completely dry and healed.  If you have removable sutures (or staples) they must be removed 10-14  days (unless otherwise instructed) from the day of your surgery.     1)  Elevate the extremity as much as possible.  2)  Keep the dressing clean and dry.  3)  Please call us if the dressing becomes wet or dirty.  4)  If you are experiencing worsening pain or worsening swelling, please call.     MEDICATIONS: Resume all previous home medications at the previous prescribed dose and frequency unless otherwise noted Start taking the  pain medications on an as-needed basis as prescribed  Please taper down pain medication over the next week following surgery.  Ideally you should not require a refill of any narcotic pain medication.  Take pain medication with food to minimize nausea. In addition to the prescribed pain medication, you may take over-the-counter pain relievers such as Tylenol.  Do NOT take additional tylenol if your pain medication already has tylenol in it.  Aspirin 325mg  daily for four weeks.      FOLLOWUP INSTRUCTIONS: 1. Follow up at the Physical Therapy Clinic 3-4 days following surgery. This appointment should be scheduled unless other arrangements have been made.The Physical Therapy scheduling number is [phone] if an appointment has not already been arranged.  2. Contact Dr. Serena Croissant office during office hours at [phone] or the practice after hours line at [phone] for non-emergencies. For medical emergencies call 911.   Discharge Location: Home    Post Anesthesia Home Care Instructions  Activity: Get plenty of rest for the remainder of the day. A responsible individual must stay with you for  24 hours following the procedure.  For the next 24 hours, DO NOT: -Drive a car -Advertising copywriter -Drink alcoholic beverages -Take any medication unless instructed by your physician -Make any legal decisions or sign important papers.  Meals: Start with liquid foods such as gelatin or soup. Progress to regular foods as tolerated. Avoid greasy, spicy, heavy  foods. If nausea and/or vomiting occur, drink only clear liquids until the nausea and/or vomiting subsides. Call your physician if vomiting continues.  Special Instructions/Symptoms: Your throat may feel dry or sore from the anesthesia or the breathing tube placed in your throat during surgery. If this causes discomfort, gargle with warm salt water. The discomfort should disappear within 24 hours.  If you had a scopolamine patch placed behind your ear for the management of post- operative nausea and/or vomiting:  1. The medication in the patch is effective for 72 hours, after which it should be removed.  Wrap patch in a tissue and discard in the trash. Wash hands thoroughly with soap and water. 2. You may remove the patch earlier than 72 hours if you experience unpleasant side effects which may include dry mouth, dizziness or visual disturbances. 3. Avoid touching the patch. Wash your hands with soap and water after contact with the patch.    Regional Anesthesia Blocks  1. You may not be able to move or feel the "blocked" extremity after a regional anesthetic block. This may last may last from 3-48 hours after placement, but it will go away. The length of time depends on the medication injected and your individual response to the medication. As the nerves start to wake up, you may experience tingling as the movement and feeling returns to your extremity. If the numbness and inability to move your extremity has not gone away after 48 hours, please call your surgeon.   2. The extremity that is blocked will need to be protected until the numbness is gone and the strength has returned. Because you cannot feel it, you will need to take extra care to avoid injury. Because it may be weak, you may have difficulty moving it or using it. You may not know what position it is in without looking at it while the block is in effect.  3. For blocks in the legs and feet, returning to weight bearing and walking needs  to be done carefully. You will need to wait until the numbness is entirely gone and the strength has returned. You should be able to move your leg and foot normally before you try and bear weight or walk. You will need someone to be with you when you first try to ensure you do not fall and possibly risk injury.  4. Bruising and tenderness at the needle site are common side effects and will resolve in a few days.  5. Persistent numbness or new problems with movement should be communicated to the surgeon or the [facility] [phone] [facility] [phone]).

## 2023-08-29 NOTE — Anesthesia Procedure Notes (Signed)
Anesthesia Procedure Image    

## 2023-08-29 NOTE — Anesthesia Postprocedure Evaluation (Signed)
Anesthesia Post Note  Patient: Alvin Hodges  Procedure(s) Performed: RIGHT KNEE ARTHROSCOPY WITH MEDIAL  MENISCAL REPAIR (Right: Knee)     Patient location during evaluation: PACU Anesthesia Type: General Level of consciousness: awake and alert Pain management: pain level controlled Vital Signs Assessment: post-procedure vital signs reviewed and stable Respiratory status: spontaneous breathing, nonlabored ventilation, respiratory function stable and patient connected to nasal cannula oxygen Cardiovascular status: blood pressure returned to baseline and stable Postop Assessment: no apparent nausea or vomiting Anesthetic complications: no  No notable events documented.  Last Vitals:  Vitals:   08/29/23 1300 08/29/23 1315  BP: 123/83 125/81  Pulse: 84 87  Resp: 15 13  Temp:    SpO2: 93% 93%    Last Pain:  Vitals:   08/29/23 1315  TempSrc:   PainSc: 0-No pain        RLE Motor Response: Purposeful movement (08/29/23 1315) RLE Sensation: Full sensation (08/29/23 1315)      Brahim Dolman S

## 2023-08-30 ENCOUNTER — Encounter (HOSPITAL_BASED_OUTPATIENT_CLINIC_OR_DEPARTMENT_OTHER): Payer: Self-pay | Admitting: Orthopaedic Surgery

## 2023-09-01 ENCOUNTER — Encounter (HOSPITAL_BASED_OUTPATIENT_CLINIC_OR_DEPARTMENT_OTHER): Payer: Self-pay | Admitting: Physical Therapy

## 2023-09-01 ENCOUNTER — Ambulatory Visit (HOSPITAL_BASED_OUTPATIENT_CLINIC_OR_DEPARTMENT_OTHER): Payer: 59 | Attending: Orthopaedic Surgery | Admitting: Physical Therapy

## 2023-09-01 DIAGNOSIS — R2689 Other abnormalities of gait and mobility: Secondary | ICD-10-CM | POA: Diagnosis not present

## 2023-09-01 DIAGNOSIS — R600 Localized edema: Secondary | ICD-10-CM | POA: Insufficient documentation

## 2023-09-01 DIAGNOSIS — M25561 Pain in right knee: Secondary | ICD-10-CM | POA: Diagnosis present

## 2023-09-01 DIAGNOSIS — Z9889 Other specified postprocedural states: Secondary | ICD-10-CM | POA: Diagnosis not present

## 2023-09-01 DIAGNOSIS — M25661 Stiffness of right knee, not elsewhere classified: Secondary | ICD-10-CM | POA: Diagnosis not present

## 2023-09-01 NOTE — Therapy (Signed)
OUTPATIENT PHYSICAL THERAPY LOWER EXTREMITY EVALUATION   Patient Name: Alvin Hodges MRN: [id number] DOB:[birth year], 62 y.o., male Today's Date: 09/01/2023  END OF SESSION:  PT End of Session - 09/01/23 1149     Visit Number 1    Number of Visits 24    Date for PT Re-Evaluation 11/24/23    PT Start Time 1145    PT Stop Time 1228    PT Time Calculation (min) 43 min    Activity Tolerance Patient tolerated treatment well    Behavior During Therapy WFL for tasks assessed/performed             Past Medical History:  Diagnosis Date   DM (diabetes mellitus) (HCC)    Past Surgical History:  Procedure Laterality Date   APPENDECTOMY     KNEE ARTHROSCOPY WITH MENISCAL REPAIR Right 08/29/2023   Procedure: RIGHT KNEE ARTHROSCOPY WITH MEDIAL  MENISCAL REPAIR;  Surgeon: Huel Cote, MD;  Location: [facility];  Service: Orthopedics;  Laterality: Right;   PILONIDAL CYST EXCISION     Patient Active Problem List   Diagnosis Date Noted   Acute medial meniscus tear of right knee 08/29/2023    PCP: Deatra James MD    REFERRING PROVIDER: Dr Huel Cote   REFERRING DIAG:  Diagnosis  [phone] (ICD-10-CM) - Acute medial meniscus tear of right knee, initial encounter    THERAPY DIAG:  Acute pain of right knee  Stiffness of right knee, not elsewhere classified  Other abnormalities of gait and mobility  Localized edema  Rationale for Evaluation and Treatment: Rehabilitation  ONSET DATE: 08/29/2023  SUBJECTIVE:   SUBJECTIVE STATEMENT: The patient was stepping up on a tractor when he felt a pop. He was found to have a meniscal root tear He had a repair on 08/29/2023.   PERTINENT HISTORY: DMII PAIN:  Are you having pain? Yes: NPRS scale: 5/10 at worst  Pain location: right knee  Pain description: aching   Aggravating factors: movement  Relieving factors: rest  PRECAUTIONS: Knee  RED FLAGS: None   WEIGHT BEARING RESTRICTIONS: Yes TDWB for 2 weeks    FALLS:  Has patient fallen in last 6 months? No  LIVING ENVIRONMENT: 10 steps into the house   OCCUPATION:  Gaffer   Hobbies:  Hunting/ fishing       PLOF: Independent  PATIENT GOALS:   Get the knee back to how it was    NEXT MD VISIT:   OBJECTIVE:   DIAGNOSTIC FINDINGS:   PATIENT SURVEYS:  FOTO    COGNITION: Overall cognitive status: Within functional limits for tasks assessed     SENSATION: WFL  EDEMA:  Circumferential:     PALPATION: No significant tenderness to palpation   LOWER EXTREMITY ROM:  Passive ROM Right eval Left eval  Hip flexion    Hip extension    Hip abduction    Hip adduction    Hip internal rotation    Hip external rotation    Knee flexion  83 no pain or end feel. No pushed 2nd to protocol   Knee extension  -3  Ankle dorsiflexion    Ankle plantarflexion    Ankle inversion    Ankle eversion     (Blank rows = not tested)  LOWER EXTREMITY MMT:  MMT Right eval Left eval  Hip flexion    Hip extension    Hip abduction    Hip adduction    Hip internal rotation    Hip external rotation  Knee flexion    Knee extension    Ankle dorsiflexion    Ankle plantarflexion    Ankle inversion    Ankle eversion     (Blank rows = not tested) Bot tested 2nd to recent surgry    GAIT: Following non weight bearing percuations   TODAY'S TREATMENT:                                                                                                                              DATE:  Access Code: [email or web address] URL: [email or web address] Date: 09/01/2023 Prepared by: Lorayne Bender  Exercises - Supine Heel Slide with Strap  - 1 x daily - 7 x weekly - 3 sets - 5 reps - 5-10 sec  hold - Active Straight Leg Raise with Quad Set  - 1 x daily - 7 x weekly - 3 sets - 10 reps - Supine Quad Set  - 1 x daily - 7 x weekly - 3 sets - 10 reps - Seated Ankle Pumps  - 1 x daily - 7 x weekly - 3 sets - 10 reps  Manual:  gentle PROM into flexion    PATIENT EDUCATION:  Education details: HEP , symptom management  Person educated: Patient Education method: Explanation, Demonstration, Tactile cues, Verbal cues, and Handouts Education comprehension: verbalized understanding, returned demonstration, verbal cues required, tactile cues required, and needs further education  HOME EXERCISE PROGRAM:   ASSESSMENT:  CLINICAL IMPRESSION: Patient is a *** y.o. *** who was seen today for physical therapy evaluation and treatment for ***.   OBJECTIVE IMPAIRMENTS: Abnormal gait, decreased activity tolerance, decreased knowledge of use of DME, decreased mobility, difficulty walking, decreased ROM, decreased strength, impaired sensation, and pain.   ACTIVITY LIMITATIONS: carrying, lifting, sitting, standing, squatting, stairs, transfers, bed mobility, bathing, toileting, and locomotion level  PARTICIPATION LIMITATIONS:   PERSONAL FACTORS: 1 comorbidity: DMII  are also affecting patient's functional outcome.   REHAB POTENTIAL: Excellent  CLINICAL DECISION MAKING: Stable/uncomplicated  EVALUATION COMPLEXITY: Low   GOALS: Goals reviewed with patient? Yes  SHORT TERM GOALS: Target date: 09/29/2023   *** Baseline: Goal status: INITIAL  2.  *** Baseline:  Goal status: INITIAL  3.  *** Baseline:  Goal status: INITIAL  4.  *** Baseline:  Goal status: INITIAL  5.  *** Baseline:  Goal status: INITIAL  6.  *** Baseline:  Goal status: INITIAL  LONG TERM GOALS: Target date: ***  *** Baseline:  Goal status: INITIAL  2.  *** Baseline:  Goal status: INITIAL  3.  *** Baseline:  Goal status: INITIAL  4.  *** Baseline:  Goal status: INITIAL  5.  *** Baseline:  Goal status: INITIAL  6.  *** Baseline:  Goal status: INITIAL   PLAN:  PT FREQUENCY: {rehab frequency:25116}  PT DURATION: {rehab duration:25117}  PLANNED INTERVENTIONS: {rehab planned interventions:25118::"Therapeutic  exercises","Therapeutic activity","Neuromuscular re-education","Balance training","Gait training","Patient/Family education","Self Care","Joint mobilization"}  PLAN FOR NEXT SESSION: ***   Dessie Coma,  PT 09/01/2023, 2:25 PM

## 2023-09-03 ENCOUNTER — Encounter (HOSPITAL_BASED_OUTPATIENT_CLINIC_OR_DEPARTMENT_OTHER): Payer: Self-pay | Admitting: Physical Therapy

## 2023-09-06 ENCOUNTER — Ambulatory Visit (HOSPITAL_BASED_OUTPATIENT_CLINIC_OR_DEPARTMENT_OTHER): Payer: 59 | Admitting: Physical Therapy

## 2023-09-06 ENCOUNTER — Encounter (HOSPITAL_BASED_OUTPATIENT_CLINIC_OR_DEPARTMENT_OTHER): Payer: Self-pay | Admitting: Physical Therapy

## 2023-09-06 DIAGNOSIS — R600 Localized edema: Secondary | ICD-10-CM

## 2023-09-06 DIAGNOSIS — M25661 Stiffness of right knee, not elsewhere classified: Secondary | ICD-10-CM

## 2023-09-06 DIAGNOSIS — M25561 Pain in right knee: Secondary | ICD-10-CM | POA: Diagnosis not present

## 2023-09-06 DIAGNOSIS — R2689 Other abnormalities of gait and mobility: Secondary | ICD-10-CM

## 2023-09-06 NOTE — Therapy (Signed)
OUTPATIENT PHYSICAL THERAPY LOWER EXTREMITY EVALUATION   Patient Name: Alvin Hodges MRN: [id number] DOB:04/12/1961, 62 y.o., male Today's Date: 09/06/2023  END OF SESSION:  PT End of Session - 09/06/23 1432     Visit Number 2    Number of Visits 24    Date for PT Re-Evaluation 11/24/23    PT Start Time 1100    PT Stop Time 1143    PT Time Calculation (min) 43 min    Activity Tolerance Patient tolerated treatment well    Behavior During Therapy WFL for tasks assessed/performed              Past Medical History:  Diagnosis Date   DM (diabetes mellitus) (HCC)    Past Surgical History:  Procedure Laterality Date   APPENDECTOMY     KNEE ARTHROSCOPY WITH MENISCAL REPAIR Right 08/29/2023   Procedure: RIGHT KNEE ARTHROSCOPY WITH MEDIAL  MENISCAL REPAIR;  Surgeon: Huel Cote, MD;  Location: [facility];  Service: Orthopedics;  Laterality: Right;   PILONIDAL CYST EXCISION     Patient Active Problem List   Diagnosis Date Noted   Acute medial meniscus tear of right knee 08/29/2023    PCP: Deatra James MD    REFERRING PROVIDER: Dr Huel Cote   REFERRING DIAG:  Diagnosis  [id number] (ICD-10-CM) - Acute medial meniscus tear of right knee, initial encounter    THERAPY DIAG:  Acute pain of right knee  Stiffness of right knee, not elsewhere classified  Other abnormalities of gait and mobility  Localized edema  Rationale for Evaluation and Treatment: Rehabilitation  ONSET DATE: 08/29/2023  SUBJECTIVE:   SUBJECTIVE STATEMENT: The patient reports no pain. He has been complaint with weight bearing.    Eval:The patient was stepping up on a tractor when he felt a pop. He was found to have a meniscal root tear He had a repair on 08/29/2023.  At this time the patient is in an immobilizer.  He is touchdown weightbearing.  He has been compliant with touchdown weightbearing.  He is ambulating with crutches.  His pain is well-controlled.  PERTINENT  HISTORY: DMII PAIN:  Are you having pain? Yes: NPRS scale: 0/10 at worst  Pain location: right knee  Pain description: aching   Aggravating factors: movement  Relieving factors: rest  PRECAUTIONS: Knee  RED FLAGS: None   WEIGHT BEARING RESTRICTIONS: Yes TDWB for 2 weeks   FALLS:  Has patient fallen in last 6 months? No  LIVING ENVIRONMENT: 10 steps into the house   OCCUPATION:  Gaffer   Hobbies:  Hunting/ fishing       PLOF: Independent  PATIENT GOALS:   Get the knee back to how it was    NEXT MD VISIT:   OBJECTIVE:   DIAGNOSTIC FINDINGS:   PATIENT SURVEYS:  FOTO    COGNITION: Overall cognitive status: Within functional limits for tasks assessed     SENSATION: WFL  EDEMA:  Circumferential:     PALPATION: No significant tenderness to palpation   LOWER EXTREMITY ROM:  Passive ROM Right eval Left eval  Hip flexion    Hip extension    Hip abduction    Hip adduction    Hip internal rotation    Hip external rotation    Knee flexion  83 no pain or end feel. No pushed 2nd to protocol   Knee extension  -3  Ankle dorsiflexion    Ankle plantarflexion    Ankle inversion    Ankle eversion     (  Blank rows = not tested)  LOWER EXTREMITY MMT:  MMT Right eval Left eval  Hip flexion    Hip extension    Hip abduction    Hip adduction    Hip internal rotation    Hip external rotation    Knee flexion    Knee extension    Ankle dorsiflexion    Ankle plantarflexion    Ankle inversion    Ankle eversion     (Blank rows = not tested) Bot tested 2nd to recent surgry    GAIT: Following non weight bearing percuations   TODAY'S TREATMENT:                                                                                                                              DATE:  Access Code: [email or web address] 9/18 Manual: PROM into flexion and extension; trigger point release to IT band and hamstrings   SLR 3x10  SL SLR 3x10   Quad set x15   SAQ 3x10   Reviewed weiht bearing with crutches if he is cleared by MD.    URL: [email or web address] Date: 09/01/2023 Prepared by: Lorayne Bender  Exercises - Supine Heel Slide with Strap  - 1 x daily - 7 x weekly - 3 sets - 5 reps - 5-10 sec  hold - Active Straight Leg Raise with Quad Set  - 1 x daily - 7 x weekly - 3 sets - 10 reps - Supine Quad Set  - 1 x daily - 7 x weekly - 3 sets - 10 reps - Seated Ankle Pumps  - 1 x daily - 7 x weekly - 3 sets - 10 reps  Manual: gentle PROM into flexion    PATIENT EDUCATION:  Education details: HEP , symptom management  Person educated: Patient Education method: Explanation, Demonstration, Tactile cues, Verbal cues, and Handouts Education comprehension: verbalized understanding, returned demonstration, verbal cues required, tactile cues required, and needs further education  HOME EXERCISE PROGRAM:   ASSESSMENT:  CLINICAL IMPRESSION: The patient continues to demonstrate good quad control. His PROM is passed his protocol limit. Overall he is making great progress. We reviewed how to begin weight bearing with 2 crutches and progression to 1 crutch when able. We will likely see him before he gets to the 1 crutch point. He was strongly advised to not weight bear until cleared by the MD.   Eval Patient is a 62 year old male status post right medial meniscal root repair on 08/29/2023.  He presents with expected limitations in motion, strength, ability to ambulate, and general functional mobility.  At this time his pain is well-controlled.  His knee flexion is doing very well.  He is given a basic HEP today to begin working on.  He has good quad activation at this time.  He was fit for his brace.  He would benefit from skilled therapy to return to active lifestyle. OBJECTIVE IMPAIRMENTS: Abnormal gait, decreased activity tolerance, decreased knowledge of use  of DME, decreased mobility, difficulty walking, decreased ROM, decreased  strength, impaired sensation, and pain.   ACTIVITY LIMITATIONS: carrying, lifting, sitting, standing, squatting, stairs, transfers, bed mobility, bathing, toileting, and locomotion level  PARTICIPATION LIMITATIONS:   PERSONAL FACTORS: 1 comorbidity: DMII  are also affecting patient's functional outcome.   REHAB POTENTIAL: Excellent  CLINICAL DECISION MAKING: Stable/uncomplicated  EVALUATION COMPLEXITY: Low   GOALS: Goals reviewed with patient? Yes  SHORT TERM GOALS: Target date: 09/29/2023   Patient will increase passive knee flexion to 120 degrees Baseline: Goal status: INITIAL  2.  Patient will demonstrate full knee extension. Baseline:  Goal status: INITIAL  3.  Patient will ambulate 500 feet without crutches without increased pain Baseline:  Goal status: INITIAL  4.  Patient will wean out of brace per MD recommendation Baseline:  Goal status: INITIAL   LONG TERM GOALS: Target date: 11/26/2023    Patient will ambulate community distances without pain Baseline:  Goal status: INITIAL  2.  Patient will go up and down 8 steps with reciprocal gait pattern Baseline:  Goal status: INITIAL  3.  Patient will have full exercise program Baseline:  Goal status: INITIAL   PLAN:  PT FREQUENCY: 2x/week  PT DURATION: 12 weeks  PLANNED INTERVENTIONS: Therapeutic exercises, Therapeutic activity, Neuromuscular re-education, Balance training, Gait training, Patient/Family education, Self Care, Joint mobilization, Stair training, DME instructions, Aquatic Therapy, Dry Needling, Electrical stimulation, Cryotherapy, Moist heat, Taping, Manual therapy, and Re-evaluation.   PLAN FOR NEXT SESSION: Begin per inside-out meniscal repair protocol   Dessie Coma, PT 09/06/2023, 2:49 PM

## 2023-09-11 ENCOUNTER — Ambulatory Visit (INDEPENDENT_AMBULATORY_CARE_PROVIDER_SITE_OTHER): Payer: 59 | Admitting: Orthopaedic Surgery

## 2023-09-11 DIAGNOSIS — S83241A Other tear of medial meniscus, current injury, right knee, initial encounter: Secondary | ICD-10-CM

## 2023-09-11 NOTE — Progress Notes (Signed)
Post Operative Evaluation    Procedure/Date of Surgery: Right medial meniscal repair 9/10  Interval History:   Presents today 2 weeks status post above procedure.  Overall he is doing very well.  Has been compliant with nonweightbearing status.  He does have an upcoming trip to [place] schedule   PMH/PSH/Family History/Social History/Meds/Allergies:    Past Medical History:  Diagnosis Date   DM (diabetes mellitus) (HCC)    Past Surgical History:  Procedure Laterality Date   APPENDECTOMY     KNEE ARTHROSCOPY WITH MENISCAL REPAIR Right 08/29/2023   Procedure: RIGHT KNEE ARTHROSCOPY WITH MEDIAL  MENISCAL REPAIR;  Surgeon: Huel Cote, MD;  Location: [facility];  Service: Orthopedics;  Laterality: Right;   PILONIDAL CYST EXCISION     Social History   Socioeconomic History   Marital status: Married    Spouse name: Not on file   Number of children: Not on file   Years of education: Not on file   Highest education level: Not on file  Occupational History   Not on file  Tobacco Use   Smoking status: Every Day    Types: Cigarettes   Smokeless tobacco: Never  Substance and Sexual Activity   Alcohol use: Yes    Comment: rare   Drug use: Never   Sexual activity: Not on file  Other Topics Concern   Not on file  Social History Narrative   Not on file   Social Determinants of Health   Financial Resource Strain: Not on file  Food Insecurity: Not on file  Transportation Needs: Not on file  Physical Activity: Not on file  Stress: Not on file  Social Connections: Not on file   No family history on file. No Known Allergies Current Outpatient Medications  Medication Sig Dispense Refill   aspirin EC 325 MG tablet Take 1 tablet (325 mg total) by mouth daily. 14 tablet 0   Dapagliflozin Propanediol (FARXIGA PO) Take by mouth.     metformin (FORTAMET) 1000 MG (OSM) 24 hr tablet Take 1,000 mg by mouth daily with breakfast.      oxyCODONE (ROXICODONE) 5 MG immediate release tablet Take 1 tablet (5 mg total) by mouth every 4 (four) hours as needed for severe pain or breakthrough pain. 10 tablet 0   Semaglutide, 2 MG/DOSE, (OZEMPIC, 2 MG/DOSE,) 8 MG/3ML SOPN Inject into the skin.     testosterone cypionate (DEPOTESTOTERONE CYPIONATE) 100 MG/ML injection Inject into the muscle every 14 (fourteen) days. For IM use only     No current facility-administered medications for this visit.   No results found.  Review of Systems:   A ROS was performed including pertinent positives and negatives as documented in the HPI.   Musculoskeletal Exam:    There were no vitals taken for this visit.  Right incisions are well-appearing without erythema or drainage.  Range of motion is from 0 to 90 degrees.  Brace applied.  Mild quad atrophy.  No joint tenderness  Imaging:      I personally reviewed and interpreted the radiographs.   Assessment:   2-week status post right medial meniscal repair overall doing extremely well.  At this time we will progress weightbearing and wean out of his brace,plan to see him back in 4 weeks for reassessment  Plan :    -  Return to clinic 4 weeks for reassessment      I personally saw and evaluated the patient, and participated in the management and treatment plan.  Huel Cote, MD Attending Physician, Orthopedic Surgery  This document was dictated using Dragon voice recognition software. A reasonable attempt at proof reading has been made to minimize errors.

## 2023-09-13 ENCOUNTER — Ambulatory Visit (HOSPITAL_BASED_OUTPATIENT_CLINIC_OR_DEPARTMENT_OTHER): Payer: 59 | Admitting: Physical Therapy

## 2023-09-13 ENCOUNTER — Encounter (HOSPITAL_BASED_OUTPATIENT_CLINIC_OR_DEPARTMENT_OTHER): Payer: Self-pay | Admitting: Physical Therapy

## 2023-09-13 DIAGNOSIS — M25561 Pain in right knee: Secondary | ICD-10-CM

## 2023-09-13 DIAGNOSIS — R600 Localized edema: Secondary | ICD-10-CM

## 2023-09-13 DIAGNOSIS — R2689 Other abnormalities of gait and mobility: Secondary | ICD-10-CM

## 2023-09-13 DIAGNOSIS — M25661 Stiffness of right knee, not elsewhere classified: Secondary | ICD-10-CM

## 2023-09-13 NOTE — Therapy (Signed)
OUTPATIENT PHYSICAL THERAPY LOWER EXTREMITY EVALUATION   Patient Name: Alvin Hodges MRN: [id number] DOB:[birth year], 62 y.o., male Today's Date: 09/13/2023  END OF SESSION:  PT End of Session - 09/13/23 0933     Visit Number 3    Number of Visits 24    Date for PT Re-Evaluation 11/24/23    PT Start Time 0930    PT Stop Time 1013    PT Time Calculation (min) 43 min    Activity Tolerance Patient tolerated treatment well    Behavior During Therapy WFL for tasks assessed/performed              Past Medical History:  Diagnosis Date   DM (diabetes mellitus) (HCC)    Past Surgical History:  Procedure Laterality Date   APPENDECTOMY     KNEE ARTHROSCOPY WITH MENISCAL REPAIR Right 08/29/2023   Procedure: RIGHT KNEE ARTHROSCOPY WITH MEDIAL  MENISCAL REPAIR;  Surgeon: Huel Cote, MD;  Location: [facility];  Service: Orthopedics;  Laterality: Right;   PILONIDAL CYST EXCISION     Patient Active Problem List   Diagnosis Date Noted   Acute medial meniscus tear of right knee 08/29/2023    PCP: Deatra James MD    REFERRING PROVIDER: Dr Huel Cote   REFERRING DIAG:  Diagnosis  [phone] (ICD-10-CM) - Acute medial meniscus tear of right knee, initial encounter    THERAPY DIAG:  Acute pain of right knee  Stiffness of right knee, not elsewhere classified  Other abnormalities of gait and mobility  Localized edema  Rationale for Evaluation and Treatment: Rehabilitation  ONSET DATE: 08/29/2023  SUBJECTIVE:   SUBJECTIVE STATEMENT: The patient has been to the MD. He has been cleared for weight bearing. He has no significant pain. He has an odd feeling in his knee cap.    Eval:The patient was stepping up on a tractor when he felt a pop. He was found to have a meniscal root tear He had a repair on 08/29/2023.  At this time the patient is in an immobilizer.  He is touchdown weightbearing.  He has been compliant with touchdown weightbearing.  He is ambulating  with crutches.  His pain is well-controlled.  PERTINENT HISTORY: DMII PAIN:  Are you having pain? Yes: NPRS scale: 0/10 at worst  Pain location: right knee  Pain description: aching   Aggravating factors: movement  Relieving factors: rest  PRECAUTIONS: Knee  RED FLAGS: None   WEIGHT BEARING RESTRICTIONS: Yes TDWB for 2 weeks   FALLS:  Has patient fallen in last 6 months? No  LIVING ENVIRONMENT: 10 steps into the house   OCCUPATION:  Gaffer   Hobbies:  Hunting/ fishing       PLOF: Independent  PATIENT GOALS:   Get the knee back to how it was    NEXT MD VISIT:   OBJECTIVE:   DIAGNOSTIC FINDINGS:   PATIENT SURVEYS:  FOTO    COGNITION: Overall cognitive status: Within functional limits for tasks assessed     SENSATION: WFL  EDEMA:  Circumferential:     PALPATION: No significant tenderness to palpation   LOWER EXTREMITY ROM:  Passive ROM Right eval Left eval  Hip flexion    Hip extension    Hip abduction    Hip adduction    Hip internal rotation    Hip external rotation    Knee flexion  83 no pain or end feel. No pushed 2nd to protocol   Knee extension  -3  Ankle dorsiflexion  Ankle plantarflexion    Ankle inversion    Ankle eversion     (Blank rows = not tested)  LOWER EXTREMITY MMT:  MMT Right eval Left eval  Hip flexion    Hip extension    Hip abduction    Hip adduction    Hip internal rotation    Hip external rotation    Knee flexion    Knee extension    Ankle dorsiflexion    Ankle plantarflexion    Ankle inversion    Ankle eversion     (Blank rows = not tested) Bot tested 2nd to recent surgry    GAIT: Following non weight bearing percuations   TODAY'S TREATMENT:                                                                                                                              DATE:  Access Code: [email or web address] 09/25 Manual: PROM into flexion and extension; trigger point release to IT band and  hamstrings    SLR 3x12 SL SLR 3x12 SAQ 3x10   Heel raise 3x10   Gait: ambulation with 1 crutch         9/18 Manual: PROM into flexion and extension; trigger point release to IT band and hamstrings   SLR 3x10  SL SLR 3x10   Quad set x15  SAQ 3x10   Reviewed weiht bearing with crutches if he is cleared by MD.    URL: [email or web address] Date: 09/01/2023 Prepared by: Lorayne Bender  Exercises - Supine Heel Slide with Strap  - 1 x daily - 7 x weekly - 3 sets - 5 reps - 5-10 sec  hold - Active Straight Leg Raise with Quad Set  - 1 x daily - 7 x weekly - 3 sets - 10 reps - Supine Quad Set  - 1 x daily - 7 x weekly - 3 sets - 10 reps - Seated Ankle Pumps  - 1 x daily - 7 x weekly - 3 sets - 10 reps  Manual: gentle PROM into flexion    PATIENT EDUCATION:  Education details: HEP , symptom management  Person educated: Patient Education method: Explanation, Demonstration, Tactile cues, Verbal cues, and Handouts Education comprehension: verbalized understanding, returned demonstration, verbal cues required, tactile cues required, and needs further education  HOME EXERCISE PROGRAM: [email or web address]  ASSESSMENT:  CLINICAL IMPRESSION: The patients total arc of motion was measured at 3-110. We continues to work on weight bearing and gait training. He had no pain with exercises. We added SAQ and standing heel raise. We will continue to progress as tolerated.    Eval Patient is a 62 year old male status post right medial meniscal root repair on 08/29/2023.  He presents with expected limitations in motion, strength, ability to ambulate, and general functional mobility.  At this time his pain is well-controlled.  His knee flexion is doing very well.  He is given a basic HEP today to begin working on.  He has good quad activation at this time.  He was fit for his brace.  He would benefit from skilled therapy to return to active lifestyle. OBJECTIVE IMPAIRMENTS: Abnormal  gait, decreased activity tolerance, decreased knowledge of use of DME, decreased mobility, difficulty walking, decreased ROM, decreased strength, impaired sensation, and pain.   ACTIVITY LIMITATIONS: carrying, lifting, sitting, standing, squatting, stairs, transfers, bed mobility, bathing, toileting, and locomotion level  PARTICIPATION LIMITATIONS:   PERSONAL FACTORS: 1 comorbidity: DMII  are also affecting patient's functional outcome.   REHAB POTENTIAL: Excellent  CLINICAL DECISION MAKING: Stable/uncomplicated  EVALUATION COMPLEXITY: Low   GOALS: Goals reviewed with patient? Yes  SHORT TERM GOALS: Target date: 09/29/2023   Patient will increase passive knee flexion to 120 degrees Baseline: Goal status: INITIAL  2.  Patient will demonstrate full knee extension. Baseline:  Goal status: INITIAL  3.  Patient will ambulate 500 feet without crutches without increased pain Baseline:  Goal status: INITIAL  4.  Patient will wean out of brace per MD recommendation Baseline:  Goal status: INITIAL   LONG TERM GOALS: Target date: 11/26/2023    Patient will ambulate community distances without pain Baseline:  Goal status: INITIAL  2.  Patient will go up and down 8 steps with reciprocal gait pattern Baseline:  Goal status: INITIAL  3.  Patient will have full exercise program Baseline:  Goal status: INITIAL   PLAN:  PT FREQUENCY: 2x/week  PT DURATION: 12 weeks  PLANNED INTERVENTIONS: Therapeutic exercises, Therapeutic activity, Neuromuscular re-education, Balance training, Gait training, Patient/Family education, Self Care, Joint mobilization, Stair training, DME instructions, Aquatic Therapy, Dry Needling, Electrical stimulation, Cryotherapy, Moist heat, Taping, Manual therapy, and Re-evaluation.   PLAN FOR NEXT SESSION: Begin per inside-out meniscal repair protocol   Dessie Coma, PT 09/13/2023, 9:37 AM

## 2023-09-20 ENCOUNTER — Encounter (HOSPITAL_BASED_OUTPATIENT_CLINIC_OR_DEPARTMENT_OTHER): Payer: 59 | Admitting: Physical Therapy

## 2023-09-26 ENCOUNTER — Ambulatory Visit (HOSPITAL_BASED_OUTPATIENT_CLINIC_OR_DEPARTMENT_OTHER): Payer: 59 | Attending: Orthopaedic Surgery

## 2023-09-26 ENCOUNTER — Encounter (HOSPITAL_BASED_OUTPATIENT_CLINIC_OR_DEPARTMENT_OTHER): Payer: Self-pay

## 2023-09-26 DIAGNOSIS — R600 Localized edema: Secondary | ICD-10-CM | POA: Insufficient documentation

## 2023-09-26 DIAGNOSIS — M25561 Pain in right knee: Secondary | ICD-10-CM | POA: Insufficient documentation

## 2023-09-26 DIAGNOSIS — R2689 Other abnormalities of gait and mobility: Secondary | ICD-10-CM | POA: Diagnosis present

## 2023-09-26 DIAGNOSIS — M25661 Stiffness of right knee, not elsewhere classified: Secondary | ICD-10-CM | POA: Insufficient documentation

## 2023-09-26 NOTE — Therapy (Signed)
OUTPATIENT PHYSICAL THERAPY LOWER EXTREMITY TREATMENT   Patient Name: Alvin Hodges MRN: [id number] DOB:June 12, 1961, 62 y.o., male Today's Date: 09/26/2023  END OF SESSION:  PT End of Session - 09/26/23 1017     Visit Number 4    Number of Visits 24    Date for PT Re-Evaluation 11/24/23    PT Start Time 1017    PT Stop Time 1102    PT Time Calculation (min) 45 min    Activity Tolerance Patient tolerated treatment well    Behavior During Therapy WFL for tasks assessed/performed               Past Medical History:  Diagnosis Date   DM (diabetes mellitus) (HCC)    Past Surgical History:  Procedure Laterality Date   APPENDECTOMY     KNEE ARTHROSCOPY WITH MENISCAL REPAIR Right 08/29/2023   Procedure: RIGHT KNEE ARTHROSCOPY WITH MEDIAL  MENISCAL REPAIR;  Surgeon: Huel Cote, MD;  Location: [facility];  Service: Orthopedics;  Laterality: Right;   PILONIDAL CYST EXCISION     Patient Active Problem List   Diagnosis Date Noted   Acute medial meniscus tear of right knee 08/29/2023    PCP: Deatra James MD    REFERRING PROVIDER: Dr Huel Cote   REFERRING DIAG:  Diagnosis  [phone] (ICD-10-CM) - Acute medial meniscus tear of right knee, initial encounter    THERAPY DIAG:  Acute pain of right knee  Stiffness of right knee, not elsewhere classified  Localized edema  Other abnormalities of gait and mobility  Rationale for Evaluation and Treatment: Rehabilitation  ONSET DATE: 08/29/2023  SUBJECTIVE:   SUBJECTIVE STATEMENT: Pt arrived with brace and single crutch. No pain at rest, just if he step a certain way. "The brace slides down a lot."   Eval:The patient was stepping up on a tractor when he felt a pop. He was found to have a meniscal root tear He had a repair on 08/29/2023.  At this time the patient is in an immobilizer.  He is touchdown weightbearing.  He has been compliant with touchdown weightbearing.  He is ambulating with crutches.  His  pain is well-controlled.  PERTINENT HISTORY: DMII PAIN:  Are you having pain? Yes: NPRS scale: 0/10 at worst  Pain location: right knee  Pain description: aching   Aggravating factors: movement  Relieving factors: rest  PRECAUTIONS: Knee  RED FLAGS: None   WEIGHT BEARING RESTRICTIONS: Yes TDWB for 2 weeks   FALLS:  Has patient fallen in last 6 months? No  LIVING ENVIRONMENT: 10 steps into the house   OCCUPATION:  Gaffer   Hobbies:  Hunting/ fishing       PLOF: Independent  PATIENT GOALS:   Get the knee back to how it was    NEXT MD VISIT:   OBJECTIVE:   DIAGNOSTIC FINDINGS:   PATIENT SURVEYS:  FOTO    COGNITION: Overall cognitive status: Within functional limits for tasks assessed     SENSATION: WFL  EDEMA:  Circumferential:     PALPATION: No significant tenderness to palpation   LOWER EXTREMITY ROM:  Passive ROM Right eval Left eval Left 10/8  Hip flexion     Hip extension     Hip abduction     Hip adduction     Hip internal rotation     Hip external rotation     Knee flexion  83 no pain or end feel. No pushed 2nd to protocol  122deg  Knee extension  -  3 0  Ankle dorsiflexion     Ankle plantarflexion     Ankle inversion     Ankle eversion      (Blank rows = not tested)  LOWER EXTREMITY MMT:  MMT Right eval Left eval  Hip flexion    Hip extension    Hip abduction    Hip adduction    Hip internal rotation    Hip external rotation    Knee flexion    Knee extension    Ankle dorsiflexion    Ankle plantarflexion    Ankle inversion    Ankle eversion     (Blank rows = not tested) Bot tested 2nd to recent surgry    GAIT: Following non weight bearing percuations   TODAY'S TREATMENT:                                                                                                                              DATE:  Access Code: [email or web address]  10/8 -PROM -STM distal quads -updated ROM -SAQ 3# 5" x30 -SLR  3x10 -S/L abd 3x10 -Bridges 2x10 -Standing HR/TR 2x10 -Partial squats at rail 2x5 with brace donned (locked at 90)    09/25 Manual: PROM into flexion and extension; trigger point release to IT band and hamstrings    SLR 3x12 SL SLR 3x12 SAQ 3x10   Heel raise 3x10   Gait: ambulation with 1 crutch         9/18 Manual: PROM into flexion and extension; trigger point release to IT band and hamstrings   SLR 3x10  SL SLR 3x10   Quad set x15  SAQ 3x10   Reviewed weiht bearing with crutches if he is cleared by MD.    URL: [email or web address] Date: 09/01/2023 Prepared by: Lorayne Bender  Exercises - Supine Heel Slide with Strap  - 1 x daily - 7 x weekly - 3 sets - 5 reps - 5-10 sec  hold - Active Straight Leg Raise with Quad Set  - 1 x daily - 7 x weekly - 3 sets - 10 reps - Supine Quad Set  - 1 x daily - 7 x weekly - 3 sets - 10 reps - Seated Ankle Pumps  - 1 x daily - 7 x weekly - 3 sets - 10 reps  Manual: gentle PROM into flexion    PATIENT EDUCATION:  Education details: HEP , symptom management  Person educated: Patient Education method: Explanation, Demonstration, Tactile cues, Verbal cues, and Handouts Education comprehension: verbalized understanding, returned demonstration, verbal cues required, tactile cues required, and needs further education  HOME EXERCISE PROGRAM: [email or web address]  ASSESSMENT:  CLINICAL IMPRESSION: Pt with improved knee flexion and extension today. 0-122 actively. Pt now 4 weeks s/p. Distal quad tightness palpated so utilized STM to address this. Able to progress gently with open and closed chain exercises. Mild discomfort in knee reported with standing HR/TR. Will monitor pain level and progress as tolerated with protocol.  Eval Patient is a 62 year old male status post right medial meniscal root repair on 08/29/2023.  He presents with expected limitations in motion, strength, ability to ambulate, and general functional  mobility.  At this time his pain is well-controlled.  His knee flexion is doing very well.  He is given a basic HEP today to begin working on.  He has good quad activation at this time.  He was fit for his brace.  He would benefit from skilled therapy to return to active lifestyle. OBJECTIVE IMPAIRMENTS: Abnormal gait, decreased activity tolerance, decreased knowledge of use of DME, decreased mobility, difficulty walking, decreased ROM, decreased strength, impaired sensation, and pain.   ACTIVITY LIMITATIONS: carrying, lifting, sitting, standing, squatting, stairs, transfers, bed mobility, bathing, toileting, and locomotion level  PARTICIPATION LIMITATIONS:   PERSONAL FACTORS: 1 comorbidity: DMII  are also affecting patient's functional outcome.   REHAB POTENTIAL: Excellent  CLINICAL DECISION MAKING: Stable/uncomplicated  EVALUATION COMPLEXITY: Low   GOALS: Goals reviewed with patient? Yes  SHORT TERM GOALS: Target date: 09/29/2023   Patient will increase passive knee flexion to 120 degrees Baseline: Goal status: INITIAL  2.  Patient will demonstrate full knee extension. Baseline:  Goal status: INITIAL  3.  Patient will ambulate 500 feet without crutches without increased pain Baseline:  Goal status: INITIAL  4.  Patient will wean out of brace per MD recommendation Baseline:  Goal status: INITIAL   LONG TERM GOALS: Target date: 11/26/2023    Patient will ambulate community distances without pain Baseline:  Goal status: INITIAL  2.  Patient will go up and down 8 steps with reciprocal gait pattern Baseline:  Goal status: INITIAL  3.  Patient will have full exercise program Baseline:  Goal status: INITIAL   PLAN:  PT FREQUENCY: 2x/week  PT DURATION: 12 weeks  PLANNED INTERVENTIONS: Therapeutic exercises, Therapeutic activity, Neuromuscular re-education, Balance training, Gait training, Patient/Family education, Self Care, Joint mobilization, Stair training,  DME instructions, Aquatic Therapy, Dry Needling, Electrical stimulation, Cryotherapy, Moist heat, Taping, Manual therapy, and Re-evaluation.   PLAN FOR NEXT SESSION: Begin per inside-out meniscal repair protocol   Donnel Saxon Lisel Siegrist, PTA 09/26/2023, 11:30 AM

## 2023-09-28 ENCOUNTER — Encounter (HOSPITAL_BASED_OUTPATIENT_CLINIC_OR_DEPARTMENT_OTHER): Payer: Self-pay | Admitting: Physical Therapy

## 2023-09-28 ENCOUNTER — Ambulatory Visit (HOSPITAL_BASED_OUTPATIENT_CLINIC_OR_DEPARTMENT_OTHER): Payer: 59 | Admitting: Physical Therapy

## 2023-09-28 DIAGNOSIS — R600 Localized edema: Secondary | ICD-10-CM

## 2023-09-28 DIAGNOSIS — M25561 Pain in right knee: Secondary | ICD-10-CM | POA: Diagnosis not present

## 2023-09-28 DIAGNOSIS — M25661 Stiffness of right knee, not elsewhere classified: Secondary | ICD-10-CM

## 2023-09-28 DIAGNOSIS — R2689 Other abnormalities of gait and mobility: Secondary | ICD-10-CM

## 2023-09-28 NOTE — Therapy (Signed)
OUTPATIENT PHYSICAL THERAPY LOWER EXTREMITY TREATMENT   Patient Name: Alvin Hodges MRN: [id number] DOB:23-Dec-1960, 62 y.o., male Today's Date: 09/28/2023  END OF SESSION:  PT End of Session - 09/28/23 1105     Visit Number 5    Number of Visits 24    Date for PT Re-Evaluation 11/24/23    PT Start Time 1100    PT Stop Time 1143    PT Time Calculation (min) 43 min    Activity Tolerance Patient tolerated treatment well    Behavior During Therapy WFL for tasks assessed/performed               Past Medical History:  Diagnosis Date   DM (diabetes mellitus) (HCC)    Past Surgical History:  Procedure Laterality Date   APPENDECTOMY     KNEE ARTHROSCOPY WITH MENISCAL REPAIR Right 08/29/2023   Procedure: RIGHT KNEE ARTHROSCOPY WITH MEDIAL  MENISCAL REPAIR;  Surgeon: Huel Cote, MD;  [facility]:  [facility];  Service: Orthopedics;  Laterality: Right;   PILONIDAL CYST EXCISION     Patient Active Problem List   Diagnosis Date Noted   Acute medial meniscus tear of right knee 08/29/2023    PCP: Deatra James MD    REFERRING PROVIDER: Dr Huel Cote   REFERRING DIAG:  Diagnosis  [id number] (ICD-10-CM) - Acute medial meniscus tear of right knee, initial encounter   Days since surgery: 30   THERAPY DIAG:  Acute pain of right knee  Stiffness of right knee, not elsewhere classified  Localized edema  Other abnormalities of gait and mobility  Rationale for Evaluation and Treatment: Rehabilitation  ONSET DATE: 08/29/2023  SUBJECTIVE:   SUBJECTIVE STATEMENT: The patient continues to use the crutch for safety. He is otherwise having no issue. He has been walking some around the house without it.   Eval:The patient was stepping up on a tractor when he felt a pop. He was found to have a meniscal root tear He had a repair on 08/29/2023.  At this time the patient is in an immobilizer.  He is touchdown weightbearing.  He has been compliant with touchdown  weightbearing.  He is ambulating with crutches.  His pain is well-controlled.  PERTINENT HISTORY: DMII PAIN:  Are you having pain? Yes: NPRS scale: 0/10 at worst  Pain location: right knee  Pain description: aching   Aggravating factors: movement  Relieving factors: rest  PRECAUTIONS: Knee  RED FLAGS: None   WEIGHT BEARING RESTRICTIONS: Yes TDWB for 2 weeks   FALLS:  Has patient fallen in last 6 months? No  LIVING ENVIRONMENT: 10 steps into the house   OCCUPATION:  Gaffer   Hobbies:  Hunting/ fishing       PLOF: Independent  PATIENT GOALS:   Get the knee back to how it was    NEXT MD VISIT:   OBJECTIVE:   DIAGNOSTIC FINDINGS:   PATIENT SURVEYS:  FOTO    COGNITION: Overall cognitive status: Within functional limits for tasks assessed     SENSATION: WFL  EDEMA:  Circumferential:     PALPATION: No significant tenderness to palpation   LOWER EXTREMITY ROM:  Passive ROM Right eval Left eval Left 10/8  Hip flexion     Hip extension     Hip abduction     Hip adduction     Hip internal rotation     Hip external rotation     Knee flexion  83 no pain or end feel. No pushed 2nd to  protocol  122deg  Knee extension  -3 0  Ankle dorsiflexion     Ankle plantarflexion     Ankle inversion     Ankle eversion      (Blank rows = not tested)  LOWER EXTREMITY MMT:  MMT Right eval Left eval  Hip flexion    Hip extension    Hip abduction    Hip adduction    Hip internal rotation    Hip external rotation    Knee flexion    Knee extension    Ankle dorsiflexion    Ankle plantarflexion    Ankle inversion    Ankle eversion     (Blank rows = not tested) Bot tested 2nd to recent surgry    GAIT: Following non weight bearing percuations   TODAY'S TREATMENT:                                                                                                                              DATE:  Access Code: [email or web address]  10/8 -PROM -STM  distal quads -updated ROM -SAQ 3# 5" x30 -SLR 3x10 1.5 lbs  -S/L abd 3x10 1.5 lbs  -Bridges 2x10 -Standing HR/TR 2x20 - 2 inch step x15   Gait: walked without crutch. Patient continues to have an antalgic gait.  -   09/25 Manual: PROM into flexion and extension; trigger point release to IT band and hamstrings    SLR 3x12 SL SLR 3x12 SAQ 3x10   Heel raise 3x10   Gait: ambulation with 1 crutch         9/18 Manual: PROM into flexion and extension; trigger point release to IT band and hamstrings   SLR 3x10  SL SLR 3x10   Quad set x15  SAQ 3x10   Reviewed weiht bearing with crutches if he is cleared by MD.    URL: [email or web address] Date: 09/01/2023 Prepared by: Lorayne Bender  Exercises - Supine Heel Slide with Strap  - 1 x daily - 7 x weekly - 3 sets - 5 reps - 5-10 sec  hold - Active Straight Leg Raise with Quad Set  - 1 x daily - 7 x weekly - 3 sets - 10 reps - Supine Quad Set  - 1 x daily - 7 x weekly - 3 sets - 10 reps - Seated Ankle Pumps  - 1 x daily - 7 x weekly - 3 sets - 10 reps  Manual: gentle PROM into flexion    PATIENT EDUCATION:  Education details: HEP , symptom management  Person educated: Patient Education method: Explanation, Demonstration, Tactile cues, Verbal cues, and Handouts Education comprehension: verbalized understanding, returned demonstration, verbal cues required, tactile cues required, and needs further education  HOME EXERCISE PROGRAM: [email or web address]  ASSESSMENT:  CLINICAL IMPRESSION: Patients ROM continues to progress well. He was measured at 0-116. It was slightly more limited but we spent less time working on int. We initiated  low level stair training today and loaded  his SLR a little more. He is making great progress. He was advised to progress of crutch as he is comfortable.   Eval Patient is a 62 year old male status post right medial meniscal root repair on 08/29/2023.  He presents with expected  limitations in motion, strength, ability to ambulate, and general functional mobility.  At this time his pain is well-controlled.  His knee flexion is doing very well.  He is given a basic HEP today to begin working on.  He has good quad activation at this time.  He was fit for his brace.  He would benefit from skilled therapy to return to active lifestyle. OBJECTIVE IMPAIRMENTS: Abnormal gait, decreased activity tolerance, decreased knowledge of use of DME, decreased mobility, difficulty walking, decreased ROM, decreased strength, impaired sensation, and pain.   ACTIVITY LIMITATIONS: carrying, lifting, sitting, standing, squatting, stairs, transfers, bed mobility, bathing, toileting, and locomotion level  PARTICIPATION LIMITATIONS:   PERSONAL FACTORS: 1 comorbidity: DMII  are also affecting patient's functional outcome.   REHAB POTENTIAL: Excellent  CLINICAL DECISION MAKING: Stable/uncomplicated  EVALUATION COMPLEXITY: Low   GOALS: Goals reviewed with patient? Yes  SHORT TERM GOALS: Target date: 09/29/2023   Patient will increase passive knee flexion to 120 degrees Baseline: Goal status: INITIAL  2.  Patient will demonstrate full knee extension. Baseline:  Goal status: INITIAL  3.  Patient will ambulate 500 feet without crutches without increased pain Baseline:  Goal status: INITIAL  4.  Patient will wean out of brace per MD recommendation Baseline:  Goal status: INITIAL   LONG TERM GOALS: Target date: 11/26/2023    Patient will ambulate community distances without pain Baseline:  Goal status: INITIAL  2.  Patient will go up and down 8 steps with reciprocal gait pattern Baseline:  Goal status: INITIAL  3.  Patient will have full exercise program Baseline:  Goal status: INITIAL   PLAN:  PT FREQUENCY: 2x/week  PT DURATION: 12 weeks  PLANNED INTERVENTIONS: Therapeutic exercises, Therapeutic activity, Neuromuscular re-education, Balance training, Gait  training, Patient/Family education, Self Care, Joint mobilization, Stair training, DME instructions, Aquatic Therapy, Dry Needling, Electrical stimulation, Cryotherapy, Moist heat, Taping, Manual therapy, and Re-evaluation.   PLAN FOR NEXT SESSION: Begin per inside-out meniscal repair protocol   Dessie Coma, PT 09/28/2023, 12:16 PM

## 2023-10-03 ENCOUNTER — Ambulatory Visit (HOSPITAL_BASED_OUTPATIENT_CLINIC_OR_DEPARTMENT_OTHER): Payer: 59

## 2023-10-03 ENCOUNTER — Encounter (HOSPITAL_BASED_OUTPATIENT_CLINIC_OR_DEPARTMENT_OTHER): Payer: Self-pay

## 2023-10-03 DIAGNOSIS — M25561 Pain in right knee: Secondary | ICD-10-CM

## 2023-10-03 DIAGNOSIS — M25661 Stiffness of right knee, not elsewhere classified: Secondary | ICD-10-CM

## 2023-10-03 DIAGNOSIS — R2689 Other abnormalities of gait and mobility: Secondary | ICD-10-CM

## 2023-10-03 DIAGNOSIS — R600 Localized edema: Secondary | ICD-10-CM

## 2023-10-03 NOTE — Therapy (Signed)
OUTPATIENT PHYSICAL THERAPY LOWER EXTREMITY TREATMENT   Patient Name: Alvin Hodges MRN: [id number] DOB:Oct 02, 1961, 62 y.o., male Today's Date: 10/03/2023  END OF SESSION:  PT End of Session - 10/03/23 1047     Visit Number 6    Number of Visits 24    Date for PT Re-Evaluation 11/24/23    PT Start Time 1017    PT Stop Time 1100    PT Time Calculation (min) 43 min    Activity Tolerance Patient tolerated treatment well    Behavior During Therapy WFL for tasks assessed/performed                Past Medical History:  Diagnosis Date   DM (diabetes mellitus) (HCC)    Past Surgical History:  Procedure Laterality Date   APPENDECTOMY     KNEE ARTHROSCOPY WITH MENISCAL REPAIR Right 08/29/2023   Procedure: RIGHT KNEE ARTHROSCOPY WITH MEDIAL  MENISCAL REPAIR;  Surgeon: Huel Cote, MD;  Location:  [facility];  Service: Orthopedics;  Laterality: Right;   PILONIDAL CYST EXCISION     Patient Active Problem List   Diagnosis Date Noted   Acute medial meniscus tear of right knee 08/29/2023    PCP: Deatra James MD    REFERRING PROVIDER: Dr Huel Cote   REFERRING DIAG:  Diagnosis  [phone] (ICD-10-CM) - Acute medial meniscus tear of right knee, initial encounter   Days since surgery: 35   THERAPY DIAG:  Acute pain of right knee  Stiffness of right knee, not elsewhere classified  Localized edema  Other abnormalities of gait and mobility  Rationale for Evaluation and Treatment: Rehabilitation  ONSET DATE: 08/29/2023  SUBJECTIVE:   SUBJECTIVE STATEMENT: Pt reports he overdid it this weekend doing yard work at his [employer]. 2/10 pain level at entry.   Eval:The patient was stepping up on a tractor when he felt a pop. He was found to have a meniscal root tear He had a repair on 08/29/2023.  At this time the patient is in an immobilizer.  He is touchdown weightbearing.  He has been compliant with touchdown weightbearing.  He is ambulating with  crutches.  His pain is well-controlled.  PERTINENT HISTORY: DMII PAIN:  Are you having pain? Yes: NPRS scale: 0/10 at worst  Pain location: right knee  Pain description: aching   Aggravating factors: movement  Relieving factors: rest  PRECAUTIONS: Knee  RED FLAGS: None   WEIGHT BEARING RESTRICTIONS: Yes TDWB for 2 weeks   FALLS:  Has patient fallen in last 6 months? No  LIVING ENVIRONMENT: 10 steps into the house   OCCUPATION:  Gaffer   Hobbies:  Hunting/ fishing       PLOF: Independent  PATIENT GOALS:   Get the knee back to how it was  Walk on treadmill for 30-60minutes    NEXT MD VISIT:   OBJECTIVE:   DIAGNOSTIC FINDINGS:   PATIENT SURVEYS:  FOTO    COGNITION: Overall cognitive status: Within functional limits for tasks assessed     SENSATION: WFL  EDEMA:  Circumferential:     PALPATION: No significant tenderness to palpation   LOWER EXTREMITY ROM:  Passive ROM Right eval Left eval Left 10/8  Hip flexion     Hip extension     Hip abduction     Hip adduction     Hip internal rotation     Hip external rotation     Knee flexion  83 no pain or end feel. No pushed 2nd  to protocol  122deg  Knee extension  -3 0  Ankle dorsiflexion     Ankle plantarflexion     Ankle inversion     Ankle eversion      (Blank rows = not tested)  LOWER EXTREMITY MMT:  MMT Right eval Left eval  Hip flexion    Hip extension    Hip abduction    Hip adduction    Hip internal rotation    Hip external rotation    Knee flexion    Knee extension    Ankle dorsiflexion    Ankle plantarflexion    Ankle inversion    Ankle eversion     (Blank rows = not tested) Bot tested 2nd to recent surgry    GAIT: Following non weight bearing percuations   TODAY'S TREATMENT:                                                                                                                              DATE:  Access Code:  [email or web address]  10/15 -PROM -patella mobilizations -quads sets 5" x15 -SAQ 3# 5" x30 -SLR 3x10 0# -S/L abd 3x10 1.5 lbs  -Bridges 3x10 -Standing HR/TR x30 - 2 inch step 2x10     10/8 -PROM -STM distal quads -updated ROM -SAQ 3# 5" x30 -SLR 3x10 1.5 lbs  -Bridges 2x10 -Standing HR/TR 2x20 - 2 inch step x15   Gait: walked without crutch. Patient continues to have an antalgic gait.  -   09/25 Manual: PROM into flexion and extension; trigger point release to IT band and hamstrings    SLR 3x12 SL SLR 3x12 SAQ 3x10   Heel raise 3x10   Gait: ambulation with 1 crutch         9/18 Manual: PROM into flexion and extension; trigger point release to IT band and hamstrings   SLR 3x10  SL SLR 3x10   Quad set x15  SAQ 3x10   Reviewed weiht bearing with crutches if he is cleared by MD.    URL: [email or web address] Date: 09/01/2023 Prepared by: Lorayne Bender  Exercises - Supine Heel Slide with Strap  - 1 x daily - 7 x weekly - 3 sets - 5 reps - 5-10 sec  hold - Active Straight Leg Raise with Quad Set  - 1 x daily - 7 x weekly - 3 sets - 10 reps - Supine Quad Set  - 1 x daily - 7 x weekly - 3 sets - 10 reps - Seated Ankle Pumps  - 1 x daily - 7 x weekly - 3 sets - 10 reps  Manual: gentle PROM into flexion    PATIENT EDUCATION:  Education details: HEP , symptom management  Person educated: Patient Education method: Explanation, Demonstration, Tactile cues, Verbal cues, and Handouts Education comprehension: verbalized understanding, returned demonstration, verbal cues required, tactile cues required, and needs further education  HOME EXERCISE PROGRAM: [email or web address]  ASSESSMENT:  CLINICAL IMPRESSION: Increased swelling observed in  knee today. Incision are healing nicely. Pt with audible crepitus (non-painful) during passive flexion today. Will monitor this at future sessions. Did have mild pain with HR/TR today, which has previously not been present.  Pt instructed to continue using ice to address swelling. Will progress as appropriate.   Eval Patient is a 62 year old male status post right medial meniscal root repair on 08/29/2023.  He presents with expected limitations in motion, strength, ability to ambulate, and general functional mobility.  At this time his pain is well-controlled.  His knee flexion is doing very well.  He is given a basic HEP today to begin working on.  He has good quad activation at this time.  He was fit for his brace.  He would benefit from skilled therapy to return to active lifestyle. OBJECTIVE IMPAIRMENTS: Abnormal gait, decreased activity tolerance, decreased knowledge of use of DME, decreased mobility, difficulty walking, decreased ROM, decreased strength, impaired sensation, and pain.   ACTIVITY LIMITATIONS: carrying, lifting, sitting, standing, squatting, stairs, transfers, bed mobility, bathing, toileting, and locomotion level  PARTICIPATION LIMITATIONS:   PERSONAL FACTORS: 1 comorbidity: DMII  are also affecting patient's functional outcome.   REHAB POTENTIAL: Excellent  CLINICAL DECISION MAKING: Stable/uncomplicated  EVALUATION COMPLEXITY: Low   GOALS: Goals reviewed with patient? Yes  SHORT TERM GOALS: Target date: 09/29/2023   Patient will increase passive knee flexion to 120 degrees Baseline: Goal status: INITIAL  2.  Patient will demonstrate full knee extension. Baseline:  Goal status: INITIAL  3.  Patient will ambulate 500 feet without crutches without increased pain Baseline:  Goal status: INITIAL  4.  Patient will wean out of brace per MD recommendation Baseline:  Goal status: INITIAL   LONG TERM GOALS: Target date: 11/26/2023    Patient will ambulate community distances without pain Baseline:  Goal status: INITIAL  2.  Patient will go up and down 8 steps with reciprocal gait pattern Baseline:  Goal status: INITIAL  3.  Patient will have full exercise program Baseline:   Goal status: INITIAL   PLAN:  PT FREQUENCY: 2x/week  PT DURATION: 12 weeks  PLANNED INTERVENTIONS: Therapeutic exercises, Therapeutic activity, Neuromuscular re-education, Balance training, Gait training, Patient/Family education, Self Care, Joint mobilization, Stair training, DME instructions, Aquatic Therapy, Dry Needling, Electrical stimulation, Cryotherapy, Moist heat, Taping, Manual therapy, and Re-evaluation.   PLAN FOR NEXT SESSION: Begin per inside-out meniscal repair protocol   Donnel Saxon Adriena Manfre, PTA 10/03/2023, 11:23 AM

## 2023-10-04 ENCOUNTER — Ambulatory Visit (HOSPITAL_BASED_OUTPATIENT_CLINIC_OR_DEPARTMENT_OTHER): Payer: 59 | Admitting: Orthopaedic Surgery

## 2023-10-04 DIAGNOSIS — S83241A Other tear of medial meniscus, current injury, right knee, initial encounter: Secondary | ICD-10-CM

## 2023-10-04 NOTE — Progress Notes (Signed)
Post Operative Evaluation    Procedure/Date of Surgery: Right medial meniscal repair 9/10  Interval History:   Presents today 6 weeks status post above procedure.  He has had some pain about the medial aspect of the leg although this is continuing to improve.  He does feel like he overdid it the week before.  His range of motion and strength are much better at today's visit.   PMH/PSH/Family History/Social History/Meds/Allergies:    Past Medical History:  Diagnosis Date   DM (diabetes mellitus) (HCC)    Past Surgical History:  Procedure Laterality Date   APPENDECTOMY     KNEE ARTHROSCOPY WITH MENISCAL REPAIR Right 08/29/2023   Procedure: RIGHT KNEE ARTHROSCOPY WITH MEDIAL  MENISCAL REPAIR;  Surgeon: Huel Cote, MD;  Location: [facility];  Service: Orthopedics;  Laterality: Right;   PILONIDAL CYST EXCISION     Social History   Socioeconomic History   Marital status: Married    Spouse name: Not on file   Number of children: Not on file   Years of education: Not on file   Highest education level: Not on file  Occupational History   Not on file  Tobacco Use   Smoking status: Every Day    Types: Cigarettes   Smokeless tobacco: Never  Substance and Sexual Activity   Alcohol use: Yes    Comment: rare   Drug use: Never   Sexual activity: Not on file  Other Topics Concern   Not on file  Social History Narrative   Not on file   Social Determinants of Health   Financial Resource Strain: Not on file  Food Insecurity: Not on file  Transportation Needs: Not on file  Physical Activity: Not on file  Stress: Not on file  Social Connections: Not on file   No family history on file. No Known Allergies Current Outpatient Medications  Medication Sig Dispense Refill   aspirin EC 325 MG tablet Take 1 tablet (325 mg total) by mouth daily. 14 tablet 0   Dapagliflozin Propanediol (FARXIGA PO) Take by mouth.     metformin  (FORTAMET) 1000 MG (OSM) 24 hr tablet Take 1,000 mg by mouth daily with breakfast.     oxyCODONE (ROXICODONE) 5 MG immediate release tablet Take 1 tablet (5 mg total) by mouth every 4 (four) hours as needed for severe pain or breakthrough pain. 10 tablet 0   Semaglutide, 2 MG/DOSE, (OZEMPIC, 2 MG/DOSE,) 8 MG/3ML SOPN Inject into the skin.     testosterone cypionate (DEPOTESTOTERONE CYPIONATE) 100 MG/ML injection Inject into the muscle every 14 (fourteen) days. For IM use only     No current facility-administered medications for this visit.   No results found.  Review of Systems:   A ROS was performed including pertinent positives and negatives as documented in the HPI.   Musculoskeletal Exam:    There were no vitals taken for this visit.  Right incisions are well-appearing without erythema or drainage.  Range of motion is from 0 to 120 degrees.  Brace applied.  Improved quad atrophy.  No joint tenderness  Imaging:      I personally reviewed and interpreted the radiographs.   Assessment:   6-week status post right medial meniscal repair overall doing extremely well.  At this time I would like him to discontinue  brace usage.  I will plan to see him back in 6 weeks for reassessment.  Overall does appear to be doing well and improving  Plan :    -Return to clinic 6 weeks for reassessment      I personally saw and evaluated the patient, and participated in the management and treatment plan.  Huel Cote, MD Attending Physician, Orthopedic Surgery  This document was dictated using Dragon voice recognition software. A reasonable attempt at proof reading has been made to minimize errors.

## 2023-10-05 ENCOUNTER — Ambulatory Visit (HOSPITAL_BASED_OUTPATIENT_CLINIC_OR_DEPARTMENT_OTHER): Payer: 59

## 2023-10-05 ENCOUNTER — Encounter (HOSPITAL_BASED_OUTPATIENT_CLINIC_OR_DEPARTMENT_OTHER): Payer: Self-pay

## 2023-10-05 DIAGNOSIS — M25561 Pain in right knee: Secondary | ICD-10-CM | POA: Diagnosis not present

## 2023-10-05 DIAGNOSIS — R2689 Other abnormalities of gait and mobility: Secondary | ICD-10-CM

## 2023-10-05 DIAGNOSIS — R600 Localized edema: Secondary | ICD-10-CM

## 2023-10-05 DIAGNOSIS — M25661 Stiffness of right knee, not elsewhere classified: Secondary | ICD-10-CM

## 2023-10-05 NOTE — Therapy (Signed)
OUTPATIENT PHYSICAL THERAPY LOWER EXTREMITY TREATMENT   Patient Name: Alvin Hodges MRN: [id number] DOB:09/03/1961, 62 y.o., male Today's Date: 10/05/2023  END OF SESSION:  PT End of Session - 10/05/23 1313     Visit Number 7    Number of Visits 24    Date for PT Re-Evaluation 11/24/23    PT Start Time 1018    PT Stop Time 1100    PT Time Calculation (min) 42 min    Activity Tolerance Patient tolerated treatment well    Behavior During Therapy WFL for tasks assessed/performed                 Past Medical History:  Diagnosis Date   DM (diabetes mellitus) (HCC)    Past Surgical History:  Procedure Laterality Date   APPENDECTOMY     KNEE ARTHROSCOPY WITH MENISCAL REPAIR Right 08/29/2023   Procedure: RIGHT KNEE ARTHROSCOPY WITH MEDIAL  MENISCAL REPAIR;  Surgeon: Huel Cote, MD;  Location: [facility];  Service: Orthopedics;  Laterality: Right;   PILONIDAL CYST EXCISION     Patient Active Problem List   Diagnosis Date Noted   Acute medial meniscus tear of right knee 08/29/2023    PCP: Deatra James MD    REFERRING PROVIDER: Dr Huel Cote   REFERRING DIAG:  Diagnosis  [phone] (ICD-10-CM) - Acute medial meniscus tear of right knee, initial encounter   Days since surgery: 37   THERAPY DIAG:  Acute pain of right knee  Stiffness of right knee, not elsewhere classified  Other abnormalities of gait and mobility  Localized edema  Rationale for Evaluation and Treatment: Rehabilitation  ONSET DATE: 08/29/2023  SUBJECTIVE:   SUBJECTIVE STATEMENT: Pt reports he saw MD who d/c'd him from hinge brace. Arrives with a patella stabilizing brace. No pain at entry, just some tightness behind the knee.   Eval:The patient was stepping up on a tractor when he felt a pop. He was found to have a meniscal root tear He had a repair on 08/29/2023.  At this time the patient is in an immobilizer.  He is touchdown weightbearing.  He has been compliant with  touchdown weightbearing.  He is ambulating with crutches.  His pain is well-controlled.  PERTINENT HISTORY: DMII PAIN:  Are you having pain? No: NPRS scale: 0/10 at worst  Pain location: right knee  Pain description: aching   Aggravating factors: movement  Relieving factors: rest  PRECAUTIONS: Knee  RED FLAGS: None   WEIGHT BEARING RESTRICTIONS: Yes TDWB for 2 weeks   FALLS:  Has patient fallen in last 6 months? No  LIVING ENVIRONMENT: 10 steps into the house   OCCUPATION:  Gaffer   Hobbies:  Hunting/ fishing       PLOF: Independent  PATIENT GOALS:   Get the knee back to how it was  Walk on treadmill for 30-79minutes    NEXT MD VISIT:   OBJECTIVE:   DIAGNOSTIC FINDINGS:   PATIENT SURVEYS:  FOTO    COGNITION: Overall cognitive status: Within functional limits for tasks assessed     SENSATION: WFL  EDEMA:  Circumferential:     PALPATION: No significant tenderness to palpation   LOWER EXTREMITY ROM:  Passive ROM Right eval Left eval Left 10/8  Hip flexion     Hip extension     Hip abduction     Hip adduction     Hip internal rotation     Hip external rotation     Knee flexion  83 no pain or end feel. No pushed 2nd to protocol  122deg  Knee extension  -3 0  Ankle dorsiflexion     Ankle plantarflexion     Ankle inversion     Ankle eversion      (Blank rows = not tested)  LOWER EXTREMITY MMT:  MMT Right eval Left eval  Hip flexion    Hip extension    Hip abduction    Hip adduction    Hip internal rotation    Hip external rotation    Knee flexion    Knee extension    Ankle dorsiflexion    Ankle plantarflexion    Ankle inversion    Ankle eversion     (Blank rows = not tested) Bot tested 2nd to recent surgry    GAIT: Following non weight bearing percuations   TODAY'S TREATMENT:                                                                                                                              DATE:   Access Code: [email or web address]   10/17 -PROM -patella mobilizations HSS long sit 30sec x3 LAQ 4# 2x10 -SLR 3x10 1.5# -S/L abd 3x10 1.5 lbs  -Bridges 2x10 -Standing HR/TR x30 -Squat at rail x10   10/15 -PROM -patella mobilizations -quads sets 5" x15 -SAQ 3# 5" x30 -SLR 3x10 0# -S/L abd 3x10 1.5 lbs  -Bridges 3x10 -Standing HR/TR x30 - 2 inch step 2x10     10/8 -PROM -STM distal quads -updated ROM -SAQ 3# 5" x30 -SLR 3x10 1.5 lbs  -Bridges 2x10 -Standing HR/TR 2x20 - 2 inch step x15   Gait: walked without crutch. Patient continues to have an antalgic gait.  -   09/25 Manual: PROM into flexion and extension; trigger point release to IT band and hamstrings    SLR 3x12 SL SLR 3x12 SAQ 3x10   Heel raise 3x10   Gait: ambulation with 1 crutch         9/18 Manual: PROM into flexion and extension; trigger point release to IT band and hamstrings   SLR 3x10  SL SLR 3x10   Quad set x15  SAQ 3x10   Reviewed weiht bearing with crutches if he is cleared by MD.    URL: [email or web address] Date: 09/01/2023 Prepared by: Lorayne Bender  Exercises - Supine Heel Slide with Strap  - 1 x daily - 7 x weekly - 3 sets - 5 reps - 5-10 sec  hold - Active Straight Leg Raise with Quad Set  - 1 x daily - 7 x weekly - 3 sets - 10 reps - Supine Quad Set  - 1 x daily - 7 x weekly - 3 sets - 10 reps - Seated Ankle Pumps  - 1 x daily - 7 x weekly - 3 sets - 10 reps  Manual: gentle PROM into flexion    PATIENT EDUCATION:  Education details: HEP , symptom management  Person  educated: Patient Education method: Explanation, Demonstration, Tactile cues, Verbal cues, and Handouts Education comprehension: verbalized understanding, returned demonstration, verbal cues required, tactile cues required, and needs further education  HOME EXERCISE PROGRAM: [email or web address]  ASSESSMENT:  CLINICAL IMPRESSION: Pt continues to experience crepitus with knee flexion, though  non-painful. He did have tightness in postereolateral knee with end range flexion, which subsided with PROM. Trialed partial squats at rail which he did have mild difficulty with. Cued pt for low range with this and to avoid pain. Will continue to progress as tolerated with protocol.  Eval Patient is a 62 year old male status post right medial meniscal root repair on 08/29/2023.  He presents with expected limitations in motion, strength, ability to ambulate, and general functional mobility.  At this time his pain is well-controlled.  His knee flexion is doing very well.  He is given a basic HEP today to begin working on.  He has good quad activation at this time.  He was fit for his brace.  He would benefit from skilled therapy to return to active lifestyle. OBJECTIVE IMPAIRMENTS: Abnormal gait, decreased activity tolerance, decreased knowledge of use of DME, decreased mobility, difficulty walking, decreased ROM, decreased strength, impaired sensation, and pain.   ACTIVITY LIMITATIONS: carrying, lifting, sitting, standing, squatting, stairs, transfers, bed mobility, bathing, toileting, and locomotion level  PARTICIPATION LIMITATIONS:   PERSONAL FACTORS: 1 comorbidity: DMII  are also affecting patient's functional outcome.   REHAB POTENTIAL: Excellent  CLINICAL DECISION MAKING: Stable/uncomplicated  EVALUATION COMPLEXITY: Low   GOALS: Goals reviewed with patient? Yes  SHORT TERM GOALS: Target date: 09/29/2023   Patient will increase passive knee flexion to 120 degrees Baseline: Goal status: INITIAL  2.  Patient will demonstrate full knee extension. Baseline:  Goal status: INITIAL  3.  Patient will ambulate 500 feet without crutches without increased pain Baseline:  Goal status: INITIAL  4.  Patient will wean out of brace per MD recommendation Baseline:  Goal status: INITIAL   LONG TERM GOALS: Target date: 11/26/2023    Patient will ambulate community distances without  pain Baseline:  Goal status: INITIAL  2.  Patient will go up and down 8 steps with reciprocal gait pattern Baseline:  Goal status: INITIAL  3.  Patient will have full exercise program Baseline:  Goal status: INITIAL   PLAN:  PT FREQUENCY: 2x/week  PT DURATION: 12 weeks  PLANNED INTERVENTIONS: Therapeutic exercises, Therapeutic activity, Neuromuscular re-education, Balance training, Gait training, Patient/Family education, Self Care, Joint mobilization, Stair training, DME instructions, Aquatic Therapy, Dry Needling, Electrical stimulation, Cryotherapy, Moist heat, Taping, Manual therapy, and Re-evaluation.   PLAN FOR NEXT SESSION: Begin per inside-out meniscal repair protocol   Donnel Saxon Oberia Beaudoin, PTA 10/05/2023, 1:22 PM

## 2023-10-10 ENCOUNTER — Encounter (HOSPITAL_BASED_OUTPATIENT_CLINIC_OR_DEPARTMENT_OTHER): Payer: Self-pay | Admitting: Physical Therapy

## 2023-10-10 ENCOUNTER — Ambulatory Visit (HOSPITAL_BASED_OUTPATIENT_CLINIC_OR_DEPARTMENT_OTHER): Payer: 59 | Admitting: Physical Therapy

## 2023-10-10 DIAGNOSIS — M25661 Stiffness of right knee, not elsewhere classified: Secondary | ICD-10-CM

## 2023-10-10 DIAGNOSIS — R2689 Other abnormalities of gait and mobility: Secondary | ICD-10-CM

## 2023-10-10 DIAGNOSIS — M25561 Pain in right knee: Secondary | ICD-10-CM | POA: Diagnosis not present

## 2023-10-10 DIAGNOSIS — R600 Localized edema: Secondary | ICD-10-CM

## 2023-10-10 NOTE — Therapy (Signed)
OUTPATIENT PHYSICAL THERAPY LOWER EXTREMITY TREATMENT   Patient Name: Alvin Hodges MRN: [id number] DOB:[birth year], 62 y.o., male Today's Date: 10/10/2023  END OF SESSION:  PT End of Session - 10/10/23 1019     Visit Number 8    Number of Visits 24    Date for PT Re-Evaluation 11/24/23    PT Start Time 1015    PT Stop Time 1058    PT Time Calculation (min) 43 min    Activity Tolerance Patient tolerated treatment well    Behavior During Therapy WFL for tasks assessed/performed                 Past Medical History:  Diagnosis Date   DM (diabetes mellitus) (HCC)    Past Surgical History:  Procedure Laterality Date   APPENDECTOMY     KNEE ARTHROSCOPY WITH MENISCAL REPAIR Right 08/29/2023   Procedure: RIGHT KNEE ARTHROSCOPY WITH MEDIAL  MENISCAL REPAIR;  Surgeon: Huel Cote, MD;  Location: [facility];  Service: Orthopedics;  Laterality: Right;   PILONIDAL CYST EXCISION     Patient Active Problem List   Diagnosis Date Noted   Acute medial meniscus tear of right knee 08/29/2023    PCP: Deatra James MD    REFERRING PROVIDER: Dr Huel Cote   REFERRING DIAG:  Diagnosis  [phone] (ICD-10-CM) - Acute medial meniscus tear of right knee, initial encounter   Days since surgery: 42   THERAPY DIAG:  Acute pain of right knee  Stiffness of right knee, not elsewhere classified  Other abnormalities of gait and mobility  Localized edema  Rationale for Evaluation and Treatment: Rehabilitation  ONSET DATE: 08/29/2023  SUBJECTIVE:   SUBJECTIVE STATEMENT: Pt reports he saw MD who d/c'd him from hinge brace. Arrives with a patella stabilizing brace. No pain at entry, just some tightness behind the knee.   Eval:The patient was stepping up on a tractor when he felt a pop. He was found to have a meniscal root tear He had a repair on 08/29/2023.  At this time the patient is in an immobilizer.  He is touchdown weightbearing.  He has been compliant with  touchdown weightbearing.  He is ambulating with crutches.  His pain is well-controlled.  PERTINENT HISTORY: DMII PAIN:  Are you having pain? No: NPRS scale: 0/10 at worst  Pain location: right knee  Pain description: aching   Aggravating factors: movement  Relieving factors: rest  PRECAUTIONS: Knee  RED FLAGS: None   WEIGHT BEARING RESTRICTIONS: Yes TDWB for 2 weeks   FALLS:  Has patient fallen in last 6 months? No  LIVING ENVIRONMENT: 10 steps into the house   OCCUPATION:  Gaffer   Hobbies:  Hunting/ fishing       PLOF: Independent  PATIENT GOALS:   Get the knee back to how it was  Walk on treadmill for 30-46minutes    NEXT MD VISIT:   OBJECTIVE:   DIAGNOSTIC FINDINGS:   PATIENT SURVEYS:  FOTO    COGNITION: Overall cognitive status: Within functional limits for tasks assessed     SENSATION: WFL  EDEMA:  Circumferential:     PALPATION: No significant tenderness to palpation   LOWER EXTREMITY ROM:  Passive ROM Right eval Left eval Left 10/8 10/22   Hip flexion      Hip extension      Hip abduction      Hip adduction      Hip internal rotation      Hip external rotation  Knee flexion  83 no pain or end feel. No pushed 2nd to protocol  122deg   Knee extension  -3 0   Ankle dorsiflexion      Ankle plantarflexion      Ankle inversion      Ankle eversion       (Blank rows = not tested)  LOWER EXTREMITY MMT:  MMT Right eval Left eval  Hip flexion    Hip extension    Hip abduction    Hip adduction    Hip internal rotation    Hip external rotation    Knee flexion    Knee extension    Ankle dorsiflexion    Ankle plantarflexion    Ankle inversion    Ankle eversion     (Blank rows = not tested) Bot tested 2nd to recent surgry    GAIT: Following non weight bearing percuations   TODAY'S TREATMENT:                                                                                                                               DATE:  Access Code: [email or web address] 10/22  Manual: Prom into flexion and extension; Patella mobilization; hamstring release  SLR 3x10 2 lbs  SL SLR 3x10 2 lbs   LAQ red 3x10   Step up 4 inch 2x12  Side step 2x12 2x12 Step down 2x15 2 inch       10/17 -PROM -patella mobilizations HSS long sit 30sec x3 LAQ 4# 2x10 -SLR 3x10 1.5# -S/L abd 3x10 1.5 lbs  -Bridges 2x10 -Standing HR/TR x30 -Squat at rail x10   10/15 -PROM -patella mobilizations -quads sets 5" x15 -SAQ 3# 5" x30 -SLR 3x10 0# -S/L abd 3x10 1.5 lbs  -Bridges 3x10 -Standing HR/TR x30 - 2 inch step 2x10     10/8 -PROM -STM distal quads -updated ROM -SAQ 3# 5" x30 -SLR 3x10 1.5 lbs  -Bridges 2x10 -Standing HR/TR 2x20 - 2 inch step x15   Gait: walked without crutch. Patient continues to have an antalgic gait.  -   09/25 Manual: PROM into flexion and extension; trigger point release to IT band and hamstrings    SLR 3x12 SL SLR 3x12 SAQ 3x10   Heel raise 3x10   Gait: ambulation with 1 crutch         9/18 Manual: PROM into flexion and extension; trigger point release to IT band and hamstrings   SLR 3x10  SL SLR 3x10   Quad set x15  SAQ 3x10   Reviewed weiht bearing with crutches if he is cleared by MD.    URL: [email or web address] Date: 09/01/2023 Prepared by: Lorayne Bender  Exercises - Supine Heel Slide with Strap  - 1 x daily - 7 x weekly - 3 sets - 5 reps - 5-10 sec  hold - Active Straight Leg Raise with Quad Set  - 1 x daily - 7 x weekly - 3 sets - 10 reps -  Supine Quad Set  - 1 x daily - 7 x weekly - 3 sets - 10 reps - Seated Ankle Pumps  - 1 x daily - 7 x weekly - 3 sets - 10 reps  Manual: gentle PROM into flexion    PATIENT EDUCATION:  Education details: HEP , symptom management  Person educated: Patient Education method: Explanation, Demonstration, Tactile cues, Verbal cues, and Handouts Education comprehension: verbalized  understanding, returned demonstration, verbal cues required, tactile cues required, and needs further education  HOME EXERCISE PROGRAM: [email or web address]  ASSESSMENT:  CLINICAL IMPRESSION: Therapy reviewed FOTO score today. He is progressing ahead of schedule. He is frustrated by his progress but he was advised he is ahead of schedule functionally. He is having some type of crepitus in his knee. We continue to progress his exercises as tolerated. We began a low step today. He tolerated well.. We will continue to progress as tolerated.    Eval Patient is a 62 year old male status post right medial meniscal root repair on 08/29/2023.  He presents with expected limitations in motion, strength, ability to ambulate, and general functional mobility.  At this time his pain is well-controlled.  His knee flexion is doing very well.  He is given a basic HEP today to begin working on.  He has good quad activation at this time.  He was fit for his brace.  He would benefit from skilled therapy to return to active lifestyle. OBJECTIVE IMPAIRMENTS: Abnormal gait, decreased activity tolerance, decreased knowledge of use of DME, decreased mobility, difficulty walking, decreased ROM, decreased strength, impaired sensation, and pain.   ACTIVITY LIMITATIONS: carrying, lifting, sitting, standing, squatting, stairs, transfers, bed mobility, bathing, toileting, and locomotion level  PARTICIPATION LIMITATIONS:   PERSONAL FACTORS: 1 comorbidity: DMII  are also affecting patient's functional outcome.   REHAB POTENTIAL: Excellent  CLINICAL DECISION MAKING: Stable/uncomplicated  EVALUATION COMPLEXITY: Low   GOALS: Goals reviewed with patient? Yes  SHORT TERM GOALS: Target date: 09/29/2023   Patient will increase passive knee flexion to 120 degrees Baseline: Goal status: INITIAL  2.  Patient will demonstrate full knee extension. Baseline:  Goal status: INITIAL  3.  Patient will ambulate 500 feet without crutches  without increased pain Baseline:  Goal status: INITIAL  4.  Patient will wean out of brace per MD recommendation Baseline:  Goal status: INITIAL   LONG TERM GOALS: Target date: 11/26/2023    Patient will ambulate community distances without pain Baseline:  Goal status: INITIAL  2.  Patient will go up and down 8 steps with reciprocal gait pattern Baseline:  Goal status: INITIAL  3.  Patient will have full exercise program Baseline:  Goal status: INITIAL   PLAN:  PT FREQUENCY: 2x/week  PT DURATION: 12 weeks  PLANNED INTERVENTIONS: Therapeutic exercises, Therapeutic activity, Neuromuscular re-education, Balance training, Gait training, Patient/Family education, Self Care, Joint mobilization, Stair training, DME instructions, Aquatic Therapy, Dry Needling, Electrical stimulation, Cryotherapy, Moist heat, Taping, Manual therapy, and Re-evaluation.   PLAN FOR NEXT SESSION: Begin per inside-out meniscal repair protocol   Dessie Coma, PT 10/10/2023, 10:20 AM

## 2023-10-12 ENCOUNTER — Ambulatory Visit (HOSPITAL_BASED_OUTPATIENT_CLINIC_OR_DEPARTMENT_OTHER): Payer: 59 | Admitting: Physical Therapy

## 2023-10-12 DIAGNOSIS — M25561 Pain in right knee: Secondary | ICD-10-CM

## 2023-10-12 DIAGNOSIS — R2689 Other abnormalities of gait and mobility: Secondary | ICD-10-CM

## 2023-10-12 DIAGNOSIS — R600 Localized edema: Secondary | ICD-10-CM

## 2023-10-12 DIAGNOSIS — M25661 Stiffness of right knee, not elsewhere classified: Secondary | ICD-10-CM

## 2023-10-12 NOTE — Therapy (Signed)
OUTPATIENT PHYSICAL THERAPY LOWER EXTREMITY TREATMENT   Patient Name: Alvin Hodges MRN: [id number] DOB:02/04/1961, 62 y.o., male Today's Date: 10/12/2023  END OF SESSION:  PT End of Session - 10/12/23 1029     Visit Number 9    Number of Visits 24    Date for PT Re-Evaluation 11/24/23    PT Start Time 1015    PT Stop Time 1057    PT Time Calculation (min) 42 min    Activity Tolerance Patient tolerated treatment well    Behavior During Therapy WFL for tasks assessed/performed                 Past Medical History:  Diagnosis Date   DM (diabetes mellitus) (HCC)    Past Surgical History:  Procedure Laterality Date   APPENDECTOMY     KNEE ARTHROSCOPY WITH MENISCAL REPAIR Right 08/29/2023   Procedure: RIGHT KNEE ARTHROSCOPY WITH MEDIAL  MENISCAL REPAIR;  Surgeon: Huel Cote, MD;  Location: [facility];  Service: Orthopedics;  Laterality: Right;   PILONIDAL CYST EXCISION     Patient Active Problem List   Diagnosis Date Noted   Acute medial meniscus tear of right knee 08/29/2023    PCP: Deatra James MD    REFERRING PROVIDER: Dr Huel Cote   REFERRING DIAG:  Diagnosis  [phone] (ICD-10-CM) - Acute medial meniscus tear of right knee, initial encounter   Days since surgery: 44   THERAPY DIAG:  Acute pain of right knee  Stiffness of right knee, not elsewhere classified  Other abnormalities of gait and mobility  Localized edema  Rationale for Evaluation and Treatment: Rehabilitation  ONSET DATE: 08/29/2023  SUBJECTIVE:   SUBJECTIVE STATEMENT: Pt reports he saw MD who d/c'd him from hinge brace. Arrives with a patella stabilizing brace. No pain at entry, just some tightness behind the knee.   Eval:The patient was stepping up on a tractor when he felt a pop. He was found to have a meniscal root tear He had a repair on 08/29/2023.  At this time the patient is in an immobilizer.  He is touchdown weightbearing.  He has been compliant with  touchdown weightbearing.  He is ambulating with crutches.  His pain is well-controlled.  PERTINENT HISTORY: DMII PAIN:  Are you having pain? No: NPRS scale: 0/10 at worst  Pain location: right knee  Pain description: aching   Aggravating factors: movement  Relieving factors: rest  PRECAUTIONS: Knee  RED FLAGS: None   WEIGHT BEARING RESTRICTIONS: Yes TDWB for 2 weeks   FALLS:  Has patient fallen in last 6 months? No  LIVING ENVIRONMENT: 10 steps into the house   OCCUPATION:  Gaffer   Hobbies:  Hunting/ fishing       PLOF: Independent  PATIENT GOALS:   Get the knee back to how it was  Walk on treadmill for 30-72minutes    NEXT MD VISIT:   OBJECTIVE:   DIAGNOSTIC FINDINGS:   PATIENT SURVEYS:  FOTO    COGNITION: Overall cognitive status: Within functional limits for tasks assessed     SENSATION: WFL  EDEMA:  Circumferential:     PALPATION: No significant tenderness to palpation   LOWER EXTREMITY ROM:  Passive ROM Right eval Left eval Left 10/8 10/22   Hip flexion      Hip extension      Hip abduction      Hip adduction      Hip internal rotation      Hip external rotation  Knee flexion  83 no pain or end feel. No pushed 2nd to protocol  122deg   Knee extension  -3 0   Ankle dorsiflexion      Ankle plantarflexion      Ankle inversion      Ankle eversion       (Blank rows = not tested)  LOWER EXTREMITY MMT:  MMT Right eval Left eval  Hip flexion    Hip extension    Hip abduction    Hip adduction    Hip internal rotation    Hip external rotation    Knee flexion    Knee extension    Ankle dorsiflexion    Ankle plantarflexion    Ankle inversion    Ankle eversion     (Blank rows = not tested) Bot tested 2nd to recent surgry    GAIT: Following non weight bearing percuations   TODAY'S TREATMENT:                                                                                                                               DATE:  Access Code: [email or web address] 10/24 Manual: Prom into flexion and extension; Patella mobilization; hamstring release  SLR 3x10 2 lbs  SL SLR 3x10 2 lbs   SAQ 3x10 3 lbs   LAQ red 3x10   Step up 4 inch 2x12   Leg press 70 lbs 3x10  Hip abduction 3x10 55 lbs  10/22  Manual: Prom into flexion and extension; Patella mobilization; hamstring release  SLR 3x10 2 lbs  SL SLR 3x10 2 lbs   LAQ red 3x10   Step up 4 inch 2x12  Side step 2x12 2x12 Step down 2x15 2 inch       10/17 -PROM -patella mobilizations HSS long sit 30sec x3 LAQ 4# 2x10 -SLR 3x10 1.5# -S/L abd 3x10 1.5 lbs  -Bridges 2x10 -Standing HR/TR x30 -Squat at rail x10   10/15 -PROM -patella mobilizations -quads sets 5" x15 -SAQ 3# 5" x30 -SLR 3x10 0# -S/L abd 3x10 1.5 lbs  -Bridges 3x10 -Standing HR/TR x30 - 2 inch step 2x10     10/8 -PROM -STM distal quads -updated ROM -SAQ 3# 5" x30 -SLR 3x10 1.5 lbs  -Bridges 2x10 -Standing HR/TR 2x20 - 2 inch step x15   Gait: walked without crutch. Patient continues to have an antalgic gait.  -   09/25 Manual: PROM into flexion and extension; trigger point release to IT band and hamstrings    SLR 3x12 SL SLR 3x12 SAQ 3x10   Heel raise 3x10   Gait: ambulation with 1 crutch         9/18 Manual: PROM into flexion and extension; trigger point release to IT band and hamstrings   SLR 3x10  SL SLR 3x10   Quad set x15  SAQ 3x10   Reviewed weiht bearing with crutches if he is cleared by MD.    URL: [email or web address] Date: 09/01/2023 Prepared  by: Lorayne Bender  Exercises - Supine Heel Slide with Strap  - 1 x daily - 7 x weekly - 3 sets - 5 reps - 5-10 sec  hold - Active Straight Leg Raise with Quad Set  - 1 x daily - 7 x weekly - 3 sets - 10 reps - Supine Quad Set  - 1 x daily - 7 x weekly - 3 sets - 10 reps - Seated Ankle Pumps  - 1 x daily - 7 x weekly - 3 sets - 10 reps  Manual: gentle PROM into  flexion    PATIENT EDUCATION:  Education details: HEP , symptom management  Person educated: Patient Education method: Explanation, Demonstration, Tactile cues, Verbal cues, and Handouts Education comprehension: verbalized understanding, returned demonstration, verbal cues required, tactile cues required, and needs further education  HOME EXERCISE PROGRAM: [email or web address]  ASSESSMENT:  CLINICAL IMPRESSION: Therapy continues to progress to patient as tolerated.  Therapy was able to increase reps with straight leg raise.  We also increased his weight with SL straight leg raise.  Patient is now 6 weeks postop.  Put him on light leg press.  Kept his RPE at 4-5.  He tolerated well.  We also performed trial of hip abduction machine with patient.  He is able to load to an RPE of 6 without increased knee pain with this exercise.  He reports he has access to his exercises gym.  Patient did not require much manual therapy today.  His range of motion was measured at 0-122. Eval Patient is a 62 year old male status post right medial meniscal root repair on 08/29/2023.  He presents with expected limitations in motion, strength, ability to ambulate, and general functional mobility.  At this time his pain is well-controlled.  His knee flexion is doing very well.  He is given a basic HEP today to begin working on.  He has good quad activation at this time.  He was fit for his brace.  He would benefit from skilled therapy to return to active lifestyle. OBJECTIVE IMPAIRMENTS: Abnormal gait, decreased activity tolerance, decreased knowledge of use of DME, decreased mobility, difficulty walking, decreased ROM, decreased strength, impaired sensation, and pain.   ACTIVITY LIMITATIONS: carrying, lifting, sitting, standing, squatting, stairs, transfers, bed mobility, bathing, toileting, and locomotion level  PARTICIPATION LIMITATIONS:   PERSONAL FACTORS: 1 comorbidity: DMII  are also affecting patient's functional outcome.    REHAB POTENTIAL: Excellent  CLINICAL DECISION MAKING: Stable/uncomplicated  EVALUATION COMPLEXITY: Low   GOALS: Goals reviewed with patient? Yes  SHORT TERM GOALS: Target date: 09/29/2023   Patient will increase passive knee flexion to 120 degrees Baseline: Goal status: Achieved 10/24  2.  Patient will demonstrate full knee extension. Baseline:  Goal status: Achieved 10/24  3.  Patient will ambulate 500 feet without crutches without increased pain Baseline:  Goal status: Not using crutches  4.  Patient will wean out of brace per MD recommendation Baseline:  Goal status: Using small brace 10/24   LONG TERM GOALS: Target date: 11/26/2023    Patient will ambulate community distances without pain Baseline:  Goal status: INITIAL  2.  Patient will go up and down 8 steps with reciprocal gait pattern Baseline:  Goal status: INITIAL  3.  Patient will have full exercise program Baseline:  Goal status: INITIAL   PLAN:  PT FREQUENCY: 2x/week  PT DURATION: 12 weeks  PLANNED INTERVENTIONS: Therapeutic exercises, Therapeutic activity, Neuromuscular re-education, Balance training, Gait training, Patient/Family education, Self Care, Joint mobilization, Stair training,  DME instructions, Aquatic Therapy, Dry Needling, Electrical stimulation, Cryotherapy, Moist heat, Taping, Manual therapy, and Re-evaluation.   PLAN FOR NEXT SESSION: Begin per inside-out meniscal repair protocol   Dessie Coma, PT 10/12/2023, 1:43 PM

## 2023-10-16 ENCOUNTER — Ambulatory Visit (HOSPITAL_BASED_OUTPATIENT_CLINIC_OR_DEPARTMENT_OTHER): Payer: 59

## 2023-10-16 ENCOUNTER — Encounter (HOSPITAL_BASED_OUTPATIENT_CLINIC_OR_DEPARTMENT_OTHER): Payer: Self-pay

## 2023-10-16 DIAGNOSIS — M25561 Pain in right knee: Secondary | ICD-10-CM | POA: Diagnosis not present

## 2023-10-16 DIAGNOSIS — M25661 Stiffness of right knee, not elsewhere classified: Secondary | ICD-10-CM

## 2023-10-16 DIAGNOSIS — R600 Localized edema: Secondary | ICD-10-CM

## 2023-10-16 DIAGNOSIS — R2689 Other abnormalities of gait and mobility: Secondary | ICD-10-CM

## 2023-10-16 NOTE — Therapy (Signed)
OUTPATIENT PHYSICAL THERAPY LOWER EXTREMITY TREATMENT   Patient Name: Alvin Hodges MRN: [id number] DOB:09-Jan-1961, 62 y.o., male Today's Date: 10/16/2023  END OF SESSION:  PT End of Session - 10/16/23 0935     Visit Number 10    Number of Visits 24    Date for PT Re-Evaluation 11/24/23    PT Start Time 0848    PT Stop Time 0930    PT Time Calculation (min) 42 min    Activity Tolerance Patient tolerated treatment well    Behavior During Therapy [facility] for tasks assessed/performed                  Past Medical History:  Diagnosis Date   DM (diabetes mellitus) (HCC)    Past Surgical History:  Procedure Laterality Date   APPENDECTOMY     KNEE ARTHROSCOPY WITH MENISCAL REPAIR Right 08/29/2023   Procedure: RIGHT KNEE ARTHROSCOPY WITH MEDIAL  MENISCAL REPAIR;  Surgeon: Huel Cote, MD;  Location: [facility];  Service: Orthopedics;  Laterality: Right;   PILONIDAL CYST EXCISION     Patient Active Problem List   Diagnosis Date Noted   Acute medial meniscus tear of right knee 08/29/2023    PCP: Deatra James MD    REFERRING PROVIDER: Dr Huel Cote   REFERRING DIAG:  Diagnosis  [phone] (ICD-10-CM) - Acute medial meniscus tear of right knee, initial encounter   Days since surgery: 48   THERAPY DIAG:  Acute pain of right knee  Other abnormalities of gait and mobility  Localized edema  Stiffness of right knee, not elsewhere classified  Rationale for Evaluation and Treatment: Rehabilitation  ONSET DATE: 08/29/2023  SUBJECTIVE:   SUBJECTIVE STATEMENT: Pt arrived with patella stabilizing brace on, ambulates with limp. [place] and his upper calf has been bothering him. Knee doing well.   Eval:The patient was stepping up on a tractor when he felt a pop. He was found to have a meniscal root tear He had a repair on 08/29/2023.  At this time the patient is in an immobilizer.  He is touchdown weightbearing.  He has  been compliant with touchdown weightbearing.  He is ambulating with crutches.  His pain is well-controlled.  PERTINENT HISTORY: DMII PAIN:  Are you having pain? No: NPRS scale: 0/10 at worst  Pain location: right knee  Pain description: aching   Aggravating factors: movement  Relieving factors: rest  PRECAUTIONS: Knee  RED FLAGS: None   WEIGHT BEARING RESTRICTIONS: Yes TDWB for 2 weeks   FALLS:  Has patient fallen in last 6 months? No  LIVING ENVIRONMENT: 10 steps into the house   OCCUPATION:  Gaffer   Hobbies:  Hunting/ fishing    PLOF: Independent  PATIENT GOALS:   Get the knee back to how it was  Walk on treadmill for 30-36minutes    OBJECTIVE:   PATIENT SURVEYS:  FOTO    COGNITION: Overall cognitive status: Within functional limits for tasks assessed     SENSATION: WFL  PALPATION: No significant tenderness to palpation   LOWER EXTREMITY ROM:  Passive ROM Right eval Left eval Left 10/8 10/22   Hip flexion      Hip extension      Hip abduction      Hip adduction      Hip internal rotation      Hip external rotation      Knee flexion  83 no pain or end feel. No pushed  2nd to protocol  122deg   Knee extension  -3 0   Ankle dorsiflexion      Ankle plantarflexion      Ankle inversion      Ankle eversion       (Blank rows = not tested)  LOWER EXTREMITY MMT:  MMT Right eval Left eval  Hip flexion    Hip extension    Hip abduction    Hip adduction    Hip internal rotation    Hip external rotation    Knee flexion    Knee extension    Ankle dorsiflexion    Ankle plantarflexion    Ankle inversion    Ankle eversion     (Blank rows = not tested) Bot tested 2nd to recent surgry    GAIT: Antalgic  TODAY'S TREATMENT:                                                                                                                              DATE:  Access Code: [email or web address]  10/28 Manual: Prom into flexion and extension;  Patella mobilization; IASTM to gastroc with roller  SLR 3x10 2 lbs  SL SLR 3x10 2 lbs   SAQ 3x10 4 lbs , 5" hold  LAQ 5lbs 5" hold 2x10  Step up 4 inch 2x12  Squats (<90deg) 2x10  Leg press 70 lbs 3x10    10/24 Manual: Prom into flexion and extension; Patella mobilization; hamstring release  SLR 3x10 2 lbs  SL SLR 3x10 2 lbs   SAQ 3x10 3 lbs   LAQ red 3x10   Step up 4 inch 2x12   Leg press 70 lbs 3x10  Hip abduction 3x10 55 lbs  10/22  Manual: Prom into flexion and extension; Patella mobilization; hamstring release  SLR 3x10 2 lbs  SL SLR 3x10 2 lbs   LAQ red 3x10   Step up 4 inch 2x12  Side step 2x12 2x12 Step down 2x15 2 inch       10/17 -PROM -patella mobilizations HSS long sit 30sec x3 LAQ 4# 2x10 -SLR 3x10 1.5# -S/L abd 3x10 1.5 lbs  -Bridges 2x10 -Standing HR/TR x30 -Squat at rail x10   10/15 -PROM -patella mobilizations -quads sets 5" x15 -SAQ 3# 5" x30 -SLR 3x10 0# -S/L abd 3x10 1.5 lbs  -Bridges 3x10 -Standing HR/TR x30 - 2 inch step 2x10     10/8 -PROM -STM distal quads -updated ROM -SAQ 3# 5" x30 -SLR 3x10 1.5 lbs  -Bridges 2x10 -Standing HR/TR 2x20 - 2 inch step x15   Gait: walked without crutch. Patient continues to have an antalgic gait.  -   09/25 Manual: PROM into flexion and extension; trigger point release to IT band and hamstrings    SLR 3x12 SL SLR 3x12 SAQ 3x10   Heel raise 3x10   Gait: ambulation with 1 crutch    PATIENT EDUCATION:  Education details: HEP , symptom management  Person educated: Patient  Education method: Explanation, Demonstration, Tactile cues, Verbal cues, and Handouts Education comprehension: verbalized understanding, returned demonstration, verbal cues required, tactile cues required, and needs further education  HOME EXERCISE PROGRAM: [email or web address]  ASSESSMENT:  CLINICAL IMPRESSION: Pt responded well to IASTM and STM to gastroc to reduce tension and improve pain level.  He denied pain in R knee with therex and TherAct. Able to progress today by increasing resistance with open chain tasks and adding squats to 90deg.   OBJECTIVE IMPAIRMENTS: Abnormal gait, decreased activity tolerance, decreased knowledge of use of DME, decreased mobility, difficulty walking, decreased ROM, decreased strength, impaired sensation, and pain.   ACTIVITY LIMITATIONS: carrying, lifting, sitting, standing, squatting, stairs, transfers, bed mobility, bathing, toileting, and locomotion level  PARTICIPATION LIMITATIONS:   PERSONAL FACTORS: 1 comorbidity: DMII  are also affecting patient's functional outcome.   REHAB POTENTIAL: Excellent  CLINICAL DECISION MAKING: Stable/uncomplicated  EVALUATION COMPLEXITY: Low   GOALS: Goals reviewed with patient? Yes  SHORT TERM GOALS: Target date: 09/29/2023   Patient will increase passive knee flexion to 120 degrees Baseline: Goal status: Achieved 10/24  2.  Patient will demonstrate full knee extension. Baseline:  Goal status: Achieved 10/24  3.  Patient will ambulate 500 feet without crutches without increased pain Baseline:  Goal status: Not using crutches  4.  Patient will wean out of brace per MD recommendation Baseline:  Goal status: Using small brace 10/24   LONG TERM GOALS: Target date: 11/26/2023    Patient will ambulate community distances without pain Baseline:  Goal status: INITIAL  2.  Patient will go up and down 8 steps with reciprocal gait pattern Baseline:  Goal status: INITIAL  3.  Patient will have full exercise program Baseline:  Goal status: INITIAL   PLAN:  PT FREQUENCY: 2x/week  PT DURATION: 12 weeks  PLANNED INTERVENTIONS: Therapeutic exercises, Therapeutic activity, Neuromuscular re-education, Balance training, Gait training, Patient/Family education, Self Care, Joint mobilization, Stair training, DME instructions, Aquatic Therapy, Dry Needling, Electrical stimulation, Cryotherapy, Moist  heat, Taping, Manual therapy, and Re-evaluation.   PLAN FOR NEXT SESSION: Begin per inside-out meniscal repair protocol   Donnel Saxon Madgie Dhaliwal, PTA 10/16/2023, 10:16 AM

## 2023-10-19 ENCOUNTER — Encounter (HOSPITAL_BASED_OUTPATIENT_CLINIC_OR_DEPARTMENT_OTHER): Payer: Self-pay | Admitting: Physical Therapy

## 2023-10-19 ENCOUNTER — Ambulatory Visit (HOSPITAL_BASED_OUTPATIENT_CLINIC_OR_DEPARTMENT_OTHER): Payer: 59 | Admitting: Physical Therapy

## 2023-10-19 DIAGNOSIS — M25561 Pain in right knee: Secondary | ICD-10-CM | POA: Diagnosis not present

## 2023-10-19 DIAGNOSIS — R2689 Other abnormalities of gait and mobility: Secondary | ICD-10-CM

## 2023-10-19 NOTE — Therapy (Signed)
OUTPATIENT PHYSICAL THERAPY LOWER EXTREMITY TREATMENT   Patient Name: Alvin Hodges MRN: [id number] DOB:[birth year], 62 y.o., male Today's Date: 10/19/2023  END OF SESSION:  PT End of Session - 10/19/23 0803     Visit Number 11    Number of Visits 24    Date for PT Re-Evaluation 11/24/23    PT Start Time 0802    PT Stop Time 0843    PT Time Calculation (min) 41 min    Activity Tolerance Patient tolerated treatment well    Behavior During Therapy [facility] for tasks assessed/performed                  Past Medical History:  Diagnosis Date   DM (diabetes mellitus) (HCC)    Past Surgical History:  Procedure Laterality Date   APPENDECTOMY     KNEE ARTHROSCOPY WITH MENISCAL REPAIR Right 08/29/2023   Procedure: RIGHT KNEE ARTHROSCOPY WITH MEDIAL  MENISCAL REPAIR;  Surgeon: Huel Cote, MD;  Location: [facility];  Service: Orthopedics;  Laterality: Right;   PILONIDAL CYST EXCISION     Patient Active Problem List   Diagnosis Date Noted   Acute medial meniscus tear of right knee 08/29/2023    PCP: Deatra James MD    REFERRING PROVIDER: Dr Huel Cote   REFERRING DIAG:  Diagnosis  [phone] (ICD-10-CM) - Acute medial meniscus tear of right knee, initial encounter   Days since surgery: 51   THERAPY DIAG:  Acute pain of right knee  Other abnormalities of gait and mobility  Rationale for Evaluation and Treatment: Rehabilitation  ONSET DATE: 08/29/2023  SUBJECTIVE:   SUBJECTIVE STATEMENT: Pt arrived with patella stabilizing brace on, ambulates with limp. [place] and his upper calf has been bothering him. Knee doing well.   Eval:The patient was stepping up on a tractor when he felt a pop. He was found to have a meniscal root tear He had a repair on 08/29/2023.  At this time the patient is in an immobilizer.  He is touchdown weightbearing.  He has been compliant with touchdown weightbearing.  He is ambulating with  crutches.  His pain is well-controlled.  PERTINENT HISTORY: DMII PAIN:  Are you having pain? No: NPRS scale: 0/10 at worst  Pain location: right knee  Pain description: aching   Aggravating factors: movement  Relieving factors: rest  PRECAUTIONS: Knee  RED FLAGS: None   WEIGHT BEARING RESTRICTIONS: Yes TDWB for 2 weeks   FALLS:  Has patient fallen in last 6 months? No  LIVING ENVIRONMENT: 10 steps into the house   OCCUPATION:  Gaffer   Hobbies:  Hunting/ fishing    PLOF: Independent  PATIENT GOALS:   Get the knee back to how it was  Walk on treadmill for 30-71minutes    OBJECTIVE:   PATIENT SURVEYS:  FOTO    COGNITION: Overall cognitive status: Within functional limits for tasks assessed     SENSATION: WFL  PALPATION: No significant tenderness to palpation   LOWER EXTREMITY ROM:  Passive ROM Right eval Left eval Left 10/8 10/22   Hip flexion      Hip extension      Hip abduction      Hip adduction      Hip internal rotation      Hip external rotation      Knee flexion  83 no pain or end feel. No pushed 2nd to protocol  122deg   Knee extension  -3  0   Ankle dorsiflexion      Ankle plantarflexion      Ankle inversion      Ankle eversion       (Blank rows = not tested)  LOWER EXTREMITY MMT:  MMT Right eval Left eval  Hip flexion    Hip extension    Hip abduction    Hip adduction    Hip internal rotation    Hip external rotation    Knee flexion    Knee extension    Ankle dorsiflexion    Ankle plantarflexion    Ankle inversion    Ankle eversion     (Blank rows = not tested) Bot tested 2nd to recent surgry    GAIT: Antalgic  TODAY'S TREATMENT:                                                                                                                              DATE:  Access Code: [email or web address]    10/31  Nu-step 5 min L5   SLR 3x10 3 lbs  SL SLR 3x10 3 lbs   SAQ 3x10 4 lbs , 5" hold  LAQ 5lbs 5" hold  2x10  Step up 4 inch 2x12  Squats (<90deg) 2x10  Leg press 90 lbs 3x10  LF Hip abduction 3x15 70 lbs  lbs       10/28 Manual: Prom into flexion and extension; Patella mobilization; IASTM to gastroc with roller  SLR 3x10 2 lbs  SL SLR 3x10 2 lbs   SAQ 3x10 4 lbs , 5" hold  LAQ 5lbs 5" hold 2x10  Step up 4 inch 2x12  Squats (<90deg) 2x10  Leg press 70 lbs 3x10    10/24 Manual: Prom into flexion and extension; Patella mobilization; hamstring release  SLR 3x10 2 lbs  SL SLR 3x10 2 lbs   SAQ 3x10 3 lbs   LAQ red 3x10   Step up 4 inch 2x12   Leg press 70 lbs 3x10  Hip abduction 3x10 55 lbs  10/22  Manual: Prom into flexion and extension; Patella mobilization; hamstring release  SLR 3x10 2 lbs  SL SLR 3x10 2 lbs   LAQ red 3x10   Step up 4 inch 2x12  Side step 2x12 2x12 Step down 2x15 2 inch       10/17 -PROM -patella mobilizations HSS long sit 30sec x3 LAQ 4# 2x10 -SLR 3x10 1.5# -S/L abd 3x10 1.5 lbs  -Bridges 2x10 -Standing HR/TR x30 -Squat at rail x10   10/15 -PROM -patella mobilizations -quads sets 5" x15 -SAQ 3# 5" x30 -SLR 3x10 0# -S/L abd 3x10 1.5 lbs  -Bridges 3x10 -Standing HR/TR x30 - 2 inch step 2x10     10/8 -PROM -STM distal quads -updated ROM -SAQ 3# 5" x30 -SLR 3x10 1.5 lbs  -Bridges 2x10 -Standing HR/TR 2x20 - 2 inch step x15   Gait: walked without crutch. Patient continues to have an antalgic gait.  -  09/25 Manual: PROM into flexion and extension; trigger point release to IT band and hamstrings    SLR 3x12 SL SLR 3x12 SAQ 3x10   Heel raise 3x10   Gait: ambulation with 1 crutch    PATIENT EDUCATION:  Education details: HEP , symptom management  Person educated: Patient Education method: Explanation, Demonstration, Tactile cues, Verbal cues, and Handouts Education comprehension: verbalized understanding, returned demonstration, verbal cues required, tactile cues required, and needs further  education  HOME EXERCISE PROGRAM: [email or web address]  ASSESSMENT:  CLINICAL IMPRESSION: Therapy progressed the patients weights. We also added in the cable walk to simulate up and down hill walking. He is making great progress. We performed trial of instability work today as well. We will continue to pgoress as tolerated.  OBJECTIVE IMPAIRMENTS: Abnormal gait, decreased activity tolerance, decreased knowledge of use of DME, decreased mobility, difficulty walking, decreased ROM, decreased strength, impaired sensation, and pain.   ACTIVITY LIMITATIONS: carrying, lifting, sitting, standing, squatting, stairs, transfers, bed mobility, bathing, toileting, and locomotion level  PARTICIPATION LIMITATIONS:   PERSONAL FACTORS: 1 comorbidity: DMII  are also affecting patient's functional outcome.   REHAB POTENTIAL: Excellent  CLINICAL DECISION MAKING: Stable/uncomplicated  EVALUATION COMPLEXITY: Low   GOALS: Goals reviewed with patient? Yes  SHORT TERM GOALS: Target date: 09/29/2023   Patient will increase passive knee flexion to 120 degrees Baseline: Goal status: Achieved 10/24  2.  Patient will demonstrate full knee extension. Baseline:  Goal status: Achieved 10/24  3.  Patient will ambulate 500 feet without crutches without increased pain Baseline:  Goal status: Not using crutches  4.  Patient will wean out of brace per MD recommendation Baseline:  Goal status: Using small brace 10/24   LONG TERM GOALS: Target date: 11/26/2023    Patient will ambulate community distances without pain Baseline:  Goal status: INITIAL  2.  Patient will go up and down 8 steps with reciprocal gait pattern Baseline:  Goal status: INITIAL  3.  Patient will have full exercise program Baseline:  Goal status: INITIAL   PLAN:  PT FREQUENCY: 2x/week  PT DURATION: 12 weeks  PLANNED INTERVENTIONS: Therapeutic exercises, Therapeutic activity, Neuromuscular re-education, Balance training, Gait  training, Patient/Family education, Self Care, Joint mobilization, Stair training, DME instructions, Aquatic Therapy, Dry Needling, Electrical stimulation, Cryotherapy, Moist heat, Taping, Manual therapy, and Re-evaluation.   PLAN FOR NEXT SESSION: Begin per inside-out meniscal repair protocol   Dessie Coma, PT 10/19/2023, 8:06 AM

## 2023-10-24 ENCOUNTER — Encounter (HOSPITAL_BASED_OUTPATIENT_CLINIC_OR_DEPARTMENT_OTHER): Payer: Self-pay | Admitting: Physical Therapy

## 2023-10-24 ENCOUNTER — Ambulatory Visit (HOSPITAL_BASED_OUTPATIENT_CLINIC_OR_DEPARTMENT_OTHER): Payer: 59 | Attending: Orthopaedic Surgery | Admitting: Physical Therapy

## 2023-10-24 DIAGNOSIS — M25561 Pain in right knee: Secondary | ICD-10-CM | POA: Diagnosis present

## 2023-10-24 DIAGNOSIS — R600 Localized edema: Secondary | ICD-10-CM | POA: Diagnosis present

## 2023-10-24 DIAGNOSIS — R2689 Other abnormalities of gait and mobility: Secondary | ICD-10-CM | POA: Insufficient documentation

## 2023-10-24 DIAGNOSIS — M25661 Stiffness of right knee, not elsewhere classified: Secondary | ICD-10-CM | POA: Diagnosis present

## 2023-10-24 NOTE — Therapy (Addendum)
OUTPATIENT PHYSICAL THERAPY LOWER EXTREMITY TREATMENT   Patient Name: Alvin Hodges MRN: [id number] DOB:01/24/1961, 62 y.o., male Today's Date: 10/24/2023  END OF SESSION:  PT End of Session - 10/24/23 0943     Visit Number 12    Number of Visits 24    Date for PT Re-Evaluation 11/24/23    PT Start Time 0934    PT Stop Time 1017    PT Time Calculation (min) 43 min                  Past Medical History:  Diagnosis Date   DM (diabetes mellitus) (HCC)    Past Surgical History:  Procedure Laterality Date   APPENDECTOMY     KNEE ARTHROSCOPY WITH MENISCAL REPAIR Right 08/29/2023   Procedure: RIGHT KNEE ARTHROSCOPY WITH MEDIAL  MENISCAL REPAIR;  Surgeon: Huel Cote, MD;  Location: [facility];  Service: Orthopedics;  Laterality: Right;   PILONIDAL CYST EXCISION     Patient Active Problem List   Diagnosis Date Noted   Acute medial meniscus tear of right knee 08/29/2023    PCP: Deatra James MD    REFERRING PROVIDER: Dr Huel Cote   REFERRING DIAG:  Diagnosis  [id number] (ICD-10-CM) - Acute medial meniscus tear of right knee, initial encounter   Days since surgery: 56   THERAPY DIAG:  No diagnosis found.  Rationale for Evaluation and Treatment: Rehabilitation  ONSET DATE: 08/29/2023  SUBJECTIVE:   SUBJECTIVE STATEMENT: Pt arrived with patella stabilizing brace on, ambulates with limp. States [place] the [place] this [place] and his upper calf has been bothering him. Knee doing well.   Eval:The patient was stepping up on a tractor when he felt a pop. He was found to have a meniscal root tear He had a repair on 08/29/2023.  At this time the patient is in an immobilizer.  He is touchdown weightbearing.  He has been compliant with touchdown weightbearing.  He is ambulating with crutches.  His pain is well-controlled.  PERTINENT HISTORY: DMII PAIN:  Are you having pain? No: NPRS scale: 0/10 at worst  Pain location: right knee  Pain  description: aching   Aggravating factors: movement  Relieving factors: rest  PRECAUTIONS: Knee  RED FLAGS: None   WEIGHT BEARING RESTRICTIONS: Yes TDWB for 2 weeks   FALLS:  Has patient fallen in last 6 months? No  LIVING ENVIRONMENT: 10 steps into the house   OCCUPATION:  Gaffer   Hobbies:  Hunting/ fishing    PLOF: Independent  PATIENT GOALS:   Get the knee back to how it was  Walk on treadmill for 30-47minutes    OBJECTIVE:   PATIENT SURVEYS:  FOTO    COGNITION: Overall cognitive status: Within functional limits for tasks assessed     SENSATION: WFL  PALPATION: No significant tenderness to palpation   LOWER EXTREMITY ROM:  Passive ROM Right eval Left eval Left 10/8 10/22   Hip flexion      Hip extension      Hip abduction      Hip adduction      Hip internal rotation      Hip external rotation      Knee flexion  83 no pain or end feel. No pushed 2nd to protocol  122deg   Knee extension  -3 0   Ankle dorsiflexion      Ankle plantarflexion      Ankle inversion      Ankle eversion       (  Blank rows = not tested)  LOWER EXTREMITY MMT:  MMT Right eval Left eval  Hip flexion    Hip extension    Hip abduction    Hip adduction    Hip internal rotation    Hip external rotation    Knee flexion    Knee extension    Ankle dorsiflexion    Ankle plantarflexion    Ankle inversion    Ankle eversion     (Blank rows = not tested) Bot tested 2nd to recent surgry    GAIT: Antalgic  TODAY'S TREATMENT:                                                                                                                              DATE:  Access Code: [email or web address] 11/5 SLR 3x10 4 lbs  SL SLR 3x10 4 lbs   LAQ 5lbs 5" hold 2x10  Leg press 90 lbs 3x10  LF Hip abduction 3x15 70 lbs  lbs   Step onto air-ex fwd and lateral x20       10/31  Nu-step 5 min L5   SLR 3x10 3 lbs  SL SLR 3x10 3 lbs   SAQ 3x10 4 lbs , 5" hold  LAQ  5lbs 5" hold 2x10  Step up 4 inch 2x12  Squats (<90deg) 2x10  Leg press 90 lbs 3x10  LF Hip abduction 3x15 70 lbs  lbs       10/28 Manual: Prom into flexion and extension; Patella mobilization; IASTM to gastroc with roller  SLR 3x10 2 lbs  SL SLR 3x10 2 lbs   SAQ 3x10 4 lbs , 5" hold  LAQ 5lbs 5" hold 2x10  Step up 4 inch 2x12  Squats (<90deg) 2x10  Leg press 70 lbs 3x10    10/24 Manual: Prom into flexion and extension; Patella mobilization; hamstring release  SLR 3x10 2 lbs  SL SLR 3x10 2 lbs   SAQ 3x10 3 lbs   LAQ red 3x10   Step up 4 inch 2x12   Leg press 70 lbs 3x10  Hip abduction 3x10 55 lbs  10/22  Manual: Prom into flexion and extension; Patella mobilization; hamstring release  SLR 3x10 2 lbs  SL SLR 3x10 2 lbs   LAQ red 3x10   Step up 4 inch 2x12  Side step 2x12 2x12 Step down 2x15 2 inch       10/17 -PROM -patella mobilizations HSS long sit 30sec x3 LAQ 4# 2x10 -SLR 3x10 1.5# -S/L abd 3x10 1.5 lbs  -Bridges 2x10 -Standing HR/TR x30 -Squat at rail x10   10/15 -PROM -patella mobilizations -quads sets 5" x15 -SAQ 3# 5" x30 -SLR 3x10 0# -S/L abd 3x10 1.5 lbs  -Bridges 3x10 -Standing HR/TR x30 - 2 inch step 2x10     10/8 -PROM -STM distal quads -updated ROM -SAQ 3# 5" x30 -SLR 3x10 1.5 lbs  -Bridges 2x10 -Standing HR/TR 2x20 - 2 inch step x15  Gait: walked without crutch. Patient continues to have an antalgic gait.  -   09/25 Manual: PROM into flexion and extension; trigger point release to IT band and hamstrings    SLR 3x12 SL SLR 3x12 SAQ 3x10   Heel raise 3x10   Gait: ambulation with 1 crutch    PATIENT EDUCATION:  Education details: HEP , symptom management  Person educated: Patient Education method: Explanation, Demonstration, Tactile cues, Verbal cues, and Handouts Education comprehension: verbalized understanding, returned demonstration, verbal cues required, tactile cues required, and  needs further education  HOME EXERCISE PROGRAM: [email or web address]  ASSESSMENT:  CLINICAL IMPRESSION: Therapy added in instability work today. He reports unstable surfaces are the only area where he has trouble. We were able to advance his gym exercises. He plans on returning to the gym soon. He asked about walking on the treadmill. Therapy advised him that would be fine just no running yet. We talked to him about grading his exercises using RPE.   OBJECTIVE IMPAIRMENTS: Abnormal gait, decreased activity tolerance, decreased knowledge of use of DME, decreased mobility, difficulty walking, decreased ROM, decreased strength, impaired sensation, and pain.   ACTIVITY LIMITATIONS: carrying, lifting, sitting, standing, squatting, stairs, transfers, bed mobility, bathing, toileting, and locomotion level  PARTICIPATION LIMITATIONS:   PERSONAL FACTORS: 1 comorbidity: DMII  are also affecting patient's functional outcome.   REHAB POTENTIAL: Excellent  CLINICAL DECISION MAKING: Stable/uncomplicated  EVALUATION COMPLEXITY: Low   GOALS: Goals reviewed with patient? Yes  SHORT TERM GOALS: Target date: 09/29/2023   Patient will increase passive knee flexion to 120 degrees Baseline: Goal status: Achieved 10/24  2.  Patient will demonstrate full knee extension. Baseline:  Goal status: Achieved 10/24  3.  Patient will ambulate 500 feet without crutches without increased pain Baseline:  Goal status: Not using crutches  4.  Patient will wean out of brace per MD recommendation Baseline:  Goal status: Using small brace 10/24   LONG TERM GOALS: Target date: 11/26/2023    Patient will ambulate community distances without pain Baseline:  Goal status: INITIAL  2.  Patient will go up and down 8 steps with reciprocal gait pattern Baseline:  Goal status: INITIAL  3.  Patient will have full exercise program Baseline:  Goal status: INITIAL   PLAN:  PT FREQUENCY: 2x/week  PT DURATION: 12  weeks  PLANNED INTERVENTIONS: Therapeutic exercises, Therapeutic activity, Neuromuscular re-education, Balance training, Gait training, Patient/Family education, Self Care, Joint mobilization, Stair training, DME instructions, Aquatic Therapy, Dry Needling, Electrical stimulation, Cryotherapy, Moist heat, Taping, Manual therapy, and Re-evaluation.   PLAN FOR NEXT SESSION: Begin per inside-out meniscal repair protocol   Dessie Coma, PT 10/24/2023, 10:37 AM

## 2023-10-31 ENCOUNTER — Ambulatory Visit (HOSPITAL_BASED_OUTPATIENT_CLINIC_OR_DEPARTMENT_OTHER): Payer: 59 | Admitting: Physical Therapy

## 2023-10-31 DIAGNOSIS — M25561 Pain in right knee: Secondary | ICD-10-CM | POA: Diagnosis not present

## 2023-10-31 DIAGNOSIS — M25661 Stiffness of right knee, not elsewhere classified: Secondary | ICD-10-CM

## 2023-10-31 DIAGNOSIS — R2689 Other abnormalities of gait and mobility: Secondary | ICD-10-CM

## 2023-10-31 DIAGNOSIS — R600 Localized edema: Secondary | ICD-10-CM

## 2023-10-31 NOTE — Therapy (Signed)
OUTPATIENT PHYSICAL THERAPY LOWER EXTREMITY TREATMENT   Patient Name: Alvin Hodges MRN: [id number] DOB:1961/01/28, 62 y.o., male Today's Date: 10/31/2023  END OF SESSION:  PT End of Session - 10/31/23 1706     Visit Number 13    Number of Visits 24    Date for PT Re-Evaluation 11/24/23    PT Start Time 1645    PT Stop Time 1727    PT Time Calculation (min) 42 min    Activity Tolerance Patient tolerated treatment well    Behavior During Therapy WFL for tasks assessed/performed                   Past Medical History:  Diagnosis Date   DM (diabetes mellitus) (HCC)    Past Surgical History:  Procedure Laterality Date   APPENDECTOMY     KNEE ARTHROSCOPY WITH MENISCAL REPAIR Right 08/29/2023   Procedure: RIGHT KNEE ARTHROSCOPY WITH MEDIAL  MENISCAL REPAIR;  Surgeon: Huel Cote, MD;  Location: [facility];  Service: Orthopedics;  Laterality: Right;   PILONIDAL CYST EXCISION     Patient Active Problem List   Diagnosis Date Noted   Acute medial meniscus tear of right knee 08/29/2023    PCP: Deatra James MD    REFERRING PROVIDER: Dr Huel Cote   REFERRING DIAG:  Diagnosis  [id number] (ICD-10-CM) - Acute medial meniscus tear of right knee, initial encounter   Days since surgery: 63   THERAPY DIAG:  Acute pain of right knee  Other abnormalities of gait and mobility  Localized edema  Stiffness of right knee, not elsewhere classified  Rationale for Evaluation and Treatment: Rehabilitation  ONSET DATE: 08/29/2023  SUBJECTIVE:   SUBJECTIVE STATEMENT: Pt arrived with patella stabilizing brace on, ambulates with limp. [place] and his upper calf has been bothering him. Knee doing well.   Eval:The patient was stepping up on a tractor when he felt a pop. He was found to have a meniscal root tear He had a repair on 08/29/2023.  At this time the patient is in an immobilizer.  He is touchdown weightbearing.  He has  been compliant with touchdown weightbearing.  He is ambulating with crutches.  His pain is well-controlled.  PERTINENT HISTORY: DMII PAIN:  Are you having pain? No: NPRS scale: 0/10 at worst  Pain location: right knee  Pain description: aching   Aggravating factors: movement  Relieving factors: rest  PRECAUTIONS: Knee  RED FLAGS: None   WEIGHT BEARING RESTRICTIONS: Yes TDWB for 2 weeks   FALLS:  Has patient fallen in last 6 months? No  LIVING ENVIRONMENT: 10 steps into the house   OCCUPATION:  Gaffer   Hobbies:  Hunting/ fishing    PLOF: Independent  PATIENT GOALS:   Get the knee back to how it was  Walk on treadmill for 30-89minutes    OBJECTIVE:   PATIENT SURVEYS:  FOTO    COGNITION: Overall cognitive status: Within functional limits for tasks assessed     SENSATION: WFL  PALPATION: No significant tenderness to palpation   LOWER EXTREMITY ROM:  Passive ROM Right eval Left eval Left 10/8 10/22   Hip flexion      Hip extension      Hip abduction      Hip adduction      Hip internal rotation      Hip external rotation      Knee flexion  83 no pain or end feel. No  pushed 2nd to protocol  122deg   Knee extension  -3 0   Ankle dorsiflexion      Ankle plantarflexion      Ankle inversion      Ankle eversion       (Blank rows = not tested)  LOWER EXTREMITY MMT:  MMT Right eval Left eval  Hip flexion    Hip extension    Hip abduction    Hip adduction    Hip internal rotation    Hip external rotation    Knee flexion    Knee extension    Ankle dorsiflexion    Ankle plantarflexion    Ankle inversion    Ankle eversion     (Blank rows = not tested) Bot tested 2nd to recent surgry    GAIT: Antalgic  TODAY'S TREATMENT:                                                                                                                              DATE:  Access Code: [email or web address] 11/12 SLR 3x10 4 lbs  SL SLR 3x10 4 lbs   LAQ  5lbs 5" hold 1x10 discussed trying knee extension machine   Leg press 90 lbs 2x10 100 x10   LF Hip abduction 3x15 70 lbs  lbs   Knee extension machine 3x12 12 lbs   Step onto air-ex: x20   Step down 4 inch 2x10    11/5 SLR 3x10 4 lbs  SL SLR 3x10 4 lbs   LAQ 5lbs 5" hold 2x10  Leg press 90 lbs 3x10  LF Hip abduction 3x15 70 lbs  lbs   Step onto air-ex fwd and lateral x20       10/31  Nu-step 5 min L5   SLR 3x10 3 lbs  SL SLR 3x10 3 lbs   SAQ 3x10 4 lbs , 5" hold  LAQ 5lbs 5" hold 2x10  Step up 4 inch 2x12  Squats (<90deg) 2x10  Leg press 90 lbs 3x10  LF Hip abduction 3x15 70 lbs  lbs       10/28 Manual: Prom into flexion and extension; Patella mobilization; IASTM to gastroc with roller  SLR 3x10 2 lbs  SL SLR 3x10 2 lbs   SAQ 3x10 4 lbs , 5" hold  LAQ 5lbs 5" hold 2x10  Step up 4 inch 2x12  Squats (<90deg) 2x10  Leg press 70 lbs 3x10    10/24 Manual: Prom into flexion and extension; Patella mobilization; hamstring release  SLR 3x10 2 lbs  SL SLR 3x10 2 lbs   SAQ 3x10 3 lbs   LAQ red 3x10   Step up 4 inch 2x12   Leg press 70 lbs 3x10  Hip abduction 3x10 55 lbs  10/22  Manual: Prom into flexion and extension; Patella mobilization; hamstring release  SLR 3x10 2 lbs  SL SLR 3x10 2 lbs   LAQ red 3x10   Step up 4 inch 2x12  Side step 2x12 2x12 Step down 2x15 2 inch     PATIENT EDUCATION:  Education details: HEP , symptom management  Person educated: Patient Education method: Explanation, Demonstration, Tactile cues, Verbal cues, and Handouts Education comprehension: verbalized understanding, returned demonstration, verbal cues required, tactile cues required, and needs further education  HOME EXERCISE PROGRAM: [email or web address]  ASSESSMENT:  CLINICAL IMPRESSION: The patient's RPE was low with LAQ so we advanced him to knee extension machine. He tolerated well. Overall he is making great progress. We worked on step downs. He  tolerated well. He had no significant pain.   OBJECTIVE IMPAIRMENTS: Abnormal gait, decreased activity tolerance, decreased knowledge of use of DME, decreased mobility, difficulty walking, decreased ROM, decreased strength, impaired sensation, and pain.   ACTIVITY LIMITATIONS: carrying, lifting, sitting, standing, squatting, stairs, transfers, bed mobility, bathing, toileting, and locomotion level  PARTICIPATION LIMITATIONS:   PERSONAL FACTORS: 1 comorbidity: DMII  are also affecting patient's functional outcome.   REHAB POTENTIAL: Excellent  CLINICAL DECISION MAKING: Stable/uncomplicated  EVALUATION COMPLEXITY: Low   GOALS: Goals reviewed with patient? Yes  SHORT TERM GOALS: Target date: 09/29/2023   Patient will increase passive knee flexion to 120 degrees Baseline: Goal status: Achieved 10/24  2.  Patient will demonstrate full knee extension. Baseline:  Goal status: Achieved 10/24  3.  Patient will ambulate 500 feet without crutches without increased pain Baseline:  Goal status: Not using crutches  4.  Patient will wean out of brace per MD recommendation Baseline:  Goal status: Using small brace 10/24   LONG TERM GOALS: Target date: 11/26/2023    Patient will ambulate community distances without pain Baseline:  Goal status: INITIAL  2.  Patient will go up and down 8 steps with reciprocal gait pattern Baseline:  Goal status: INITIAL  3.  Patient will have full exercise program Baseline:  Goal status: INITIAL   PLAN:  PT FREQUENCY: 2x/week  PT DURATION: 12 weeks  PLANNED INTERVENTIONS: Therapeutic exercises, Therapeutic activity, Neuromuscular re-education, Balance training, Gait training, Patient/Family education, Self Care, Joint mobilization, Stair training, DME instructions, Aquatic Therapy, Dry Needling, Electrical stimulation, Cryotherapy, Moist heat, Taping, Manual therapy, and Re-evaluation.   PLAN FOR NEXT SESSION: Begin per inside-out  meniscal repair protocol   Dessie Coma, PT 10/31/2023, 5:11 PM

## 2023-11-01 ENCOUNTER — Encounter (HOSPITAL_BASED_OUTPATIENT_CLINIC_OR_DEPARTMENT_OTHER): Payer: Self-pay | Admitting: Physical Therapy

## 2023-11-02 ENCOUNTER — Encounter (HOSPITAL_BASED_OUTPATIENT_CLINIC_OR_DEPARTMENT_OTHER): Payer: Self-pay

## 2023-11-02 ENCOUNTER — Ambulatory Visit (HOSPITAL_BASED_OUTPATIENT_CLINIC_OR_DEPARTMENT_OTHER): Payer: 59

## 2023-11-02 DIAGNOSIS — R2689 Other abnormalities of gait and mobility: Secondary | ICD-10-CM

## 2023-11-02 DIAGNOSIS — M25561 Pain in right knee: Secondary | ICD-10-CM | POA: Diagnosis not present

## 2023-11-02 DIAGNOSIS — R600 Localized edema: Secondary | ICD-10-CM

## 2023-11-02 DIAGNOSIS — M25661 Stiffness of right knee, not elsewhere classified: Secondary | ICD-10-CM

## 2023-11-02 NOTE — Therapy (Signed)
OUTPATIENT PHYSICAL THERAPY LOWER EXTREMITY TREATMENT   Patient Name: Alvin Hodges MRN: [id number] DOB:[birth year], 62 y.o., male Today's Date: 11/02/2023  END OF SESSION:  PT End of Session - 11/02/23 0818     Visit Number 14    Number of Visits 24    Date for PT Re-Evaluation 11/24/23    PT Start Time 0803    PT Stop Time 0846    PT Time Calculation (min) 43 min                    Past Medical History:  Diagnosis Date   DM (diabetes mellitus) (HCC)    Past Surgical History:  Procedure Laterality Date   APPENDECTOMY     KNEE ARTHROSCOPY WITH MENISCAL REPAIR Right 08/29/2023   Procedure: RIGHT KNEE ARTHROSCOPY WITH MEDIAL  MENISCAL REPAIR;  Surgeon: Huel Cote, MD;  Location: [facility];  Service: Orthopedics;  Laterality: Right;   PILONIDAL CYST EXCISION     Patient Active Problem List   Diagnosis Date Noted   Acute medial meniscus tear of right knee 08/29/2023    PCP: Deatra James MD    REFERRING PROVIDER: Dr Huel Cote   REFERRING DIAG:  Diagnosis  [phone] (ICD-10-CM) - Acute medial meniscus tear of right knee, initial encounter   Days since surgery: 65   THERAPY DIAG:  Acute pain of right knee  Localized edema  Other abnormalities of gait and mobility  Stiffness of right knee, not elsewhere classified  Rationale for Evaluation and Treatment: Rehabilitation  ONSET DATE: 08/29/2023  SUBJECTIVE:   SUBJECTIVE STATEMENT: Pt reports he can tell he has made progress over the last couple weeks. Continues to wear patella stabilizing brace. HE does report increase in swelling when climbing a lot of steps/[place]   Eval:The patient was stepping up on a tractor when he felt a pop. He was found to have a meniscal root tear He had a repair on 08/29/2023.  At this time the patient is in an immobilizer.  He is touchdown weightbearing.  He has been compliant with touchdown weightbearing.  He is  ambulating with crutches.  His pain is well-controlled.  PERTINENT HISTORY: DMII PAIN:  Are you having pain? No: NPRS scale: 0/10 at worst  Pain location: right knee  Pain description: aching   Aggravating factors: movement  Relieving factors: rest  PRECAUTIONS: Knee  RED FLAGS: None   WEIGHT BEARING RESTRICTIONS: Yes TDWB for 2 weeks   FALLS:  Has patient fallen in last 6 months? No  LIVING ENVIRONMENT: 10 steps into the house   OCCUPATION:  Gaffer   Hobbies:  Hunting/ fishing    PLOF: Independent  PATIENT GOALS:   Get the knee back to how it was  Walk on treadmill for 30-78minutes    OBJECTIVE:   PATIENT SURVEYS:  FOTO    COGNITION: Overall cognitive status: Within functional limits for tasks assessed     SENSATION: WFL  PALPATION: No significant tenderness to palpation   LOWER EXTREMITY ROM:  Passive ROM Right eval Left eval Left 10/8 10/22   Hip flexion      Hip extension      Hip abduction      Hip adduction      Hip internal rotation      Hip external rotation      Knee flexion  83 no pain or end feel. No pushed 2nd to protocol  122deg  Knee extension  -3 0   Ankle dorsiflexion      Ankle plantarflexion      Ankle inversion      Ankle eversion       (Blank rows = not tested)  LOWER EXTREMITY MMT:  MMT Right eval Left eval  Hip flexion    Hip extension    Hip abduction    Hip adduction    Hip internal rotation    Hip external rotation    Knee flexion    Knee extension    Ankle dorsiflexion    Ankle plantarflexion    Ankle inversion    Ankle eversion     (Blank rows = not tested) Bot tested 2nd to recent surgry    GAIT: Antalgic  TODAY'S TREATMENT:                                                                                                                              DATE:  Access Code: [email or web address]  11/14 Recumbent bike L3 PROM knee flexion/extension SLR 3x10 4 lbs  SL SLR 3x10 4 lbs   Leg  press 90 lbs 1x10 100 2x10   Knee extension machine 3x10 20 lbs bil  Walking lunges at rail x2 laps Step up 8" x20  Step down 6 inch 2x10  Staggered sit to stands 2x10 (to elevated plinth)  11/12 SLR 3x10 4 lbs  SL SLR 3x10 4 lbs   LAQ 5lbs 5" hold 1x10 discussed trying knee extension machine   Leg press 90 lbs 2x10 100 x10   LF Hip abduction 3x15 70 lbs  lbs   Knee extension machine 3x12 12 lbs   Step onto air-ex: x20   Step down 4 inch 2x10    11/5 SLR 3x10 4 lbs  SL SLR 3x10 4 lbs   LAQ 5lbs 5" hold 2x10  Leg press 90 lbs 3x10  LF Hip abduction 3x15 70 lbs  lbs   Step onto air-ex fwd and lateral x20       10/31  Nu-step 5 min L5   SLR 3x10 3 lbs  SL SLR 3x10 3 lbs   SAQ 3x10 4 lbs , 5" hold  LAQ 5lbs 5" hold 2x10  Step up 4 inch 2x12  Squats (<90deg) 2x10  Leg press 90 lbs 3x10  LF Hip abduction 3x15 70 lbs  lbs    PATIENT EDUCATION:  Education details: HEP , symptom management  Person educated: Patient Education method: Explanation, Demonstration, Tactile cues, Verbal cues, and Handouts Education comprehension: verbalized understanding, returned demonstration, verbal cues required, tactile cues required, and needs further education  HOME EXERCISE PROGRAM: [email or web address]  ASSESSMENT:  CLINICAL IMPRESSION: Mild tightness reported at end range knee flexion passively. He did have mild medial knee discomfort with knee extension. Only mild discrepancy observed compared to contralateral side.Challenged by lunges and staggered sit to stands, though able to complete all reps. He was able to increase resistance with  leg extension machine without increase in pain.    OBJECTIVE IMPAIRMENTS: Abnormal gait, decreased activity tolerance, decreased knowledge of use of DME, decreased mobility, difficulty walking, decreased ROM, decreased strength, impaired sensation, and pain.   ACTIVITY LIMITATIONS: carrying, lifting, sitting, standing, squatting, stairs,  transfers, bed mobility, bathing, toileting, and locomotion level  PARTICIPATION LIMITATIONS:   PERSONAL FACTORS: 1 comorbidity: DMII  are also affecting patient's functional outcome.   REHAB POTENTIAL: Excellent  CLINICAL DECISION MAKING: Stable/uncomplicated  EVALUATION COMPLEXITY: Low   GOALS: Goals reviewed with patient? Yes  SHORT TERM GOALS: Target date: 09/29/2023   Patient will increase passive knee flexion to 120 degrees Baseline: Goal status: Achieved 10/24  2.  Patient will demonstrate full knee extension. Baseline:  Goal status: Achieved 10/24  3.  Patient will ambulate 500 feet without crutches without increased pain Baseline:  Goal status: Not using crutches  4.  Patient will wean out of brace per MD recommendation Baseline:  Goal status: Using small brace 10/24   LONG TERM GOALS: Target date: 11/26/2023    Patient will ambulate community distances without pain Baseline:  Goal status: INITIAL  2.  Patient will go up and down 8 steps with reciprocal gait pattern Baseline:  Goal status: INITIAL  3.  Patient will have full exercise program Baseline:  Goal status: INITIAL   PLAN:  PT FREQUENCY: 2x/week  PT DURATION: 12 weeks  PLANNED INTERVENTIONS: Therapeutic exercises, Therapeutic activity, Neuromuscular re-education, Balance training, Gait training, Patient/Family education, Self Care, Joint mobilization, Stair training, DME instructions, Aquatic Therapy, Dry Needling, Electrical stimulation, Cryotherapy, Moist heat, Taping, Manual therapy, and Re-evaluation.   PLAN FOR NEXT SESSION: Begin per inside-out meniscal repair protocol   Donnel Saxon Reagen Goates, PTA 11/02/2023, 9:06 AM

## 2023-11-07 ENCOUNTER — Encounter (HOSPITAL_BASED_OUTPATIENT_CLINIC_OR_DEPARTMENT_OTHER): Payer: Self-pay

## 2023-11-07 ENCOUNTER — Ambulatory Visit (HOSPITAL_BASED_OUTPATIENT_CLINIC_OR_DEPARTMENT_OTHER): Payer: 59

## 2023-11-07 DIAGNOSIS — R2689 Other abnormalities of gait and mobility: Secondary | ICD-10-CM

## 2023-11-07 DIAGNOSIS — M25661 Stiffness of right knee, not elsewhere classified: Secondary | ICD-10-CM

## 2023-11-07 DIAGNOSIS — M25561 Pain in right knee: Secondary | ICD-10-CM | POA: Diagnosis not present

## 2023-11-07 DIAGNOSIS — R600 Localized edema: Secondary | ICD-10-CM

## 2023-11-07 NOTE — Therapy (Signed)
OUTPATIENT PHYSICAL THERAPY LOWER EXTREMITY TREATMENT   Patient Name: Alvin Hodges MRN: [id number] DOB:[birth year], 62 y.o., male Today's Date: 11/07/2023  END OF SESSION:  PT End of Session - 11/07/23 0850     Visit Number 15    Number of Visits 24    Date for PT Re-Evaluation 11/24/23    PT Start Time 0846    PT Stop Time 0930    PT Time Calculation (min) 44 min    Activity Tolerance Patient tolerated treatment well    Behavior During Therapy [facility] for tasks assessed/performed                     Past Medical History:  Diagnosis Date   DM (diabetes mellitus) (HCC)    Past Surgical History:  Procedure Laterality Date   APPENDECTOMY     KNEE ARTHROSCOPY WITH MENISCAL REPAIR Right 08/29/2023   Procedure: RIGHT KNEE ARTHROSCOPY WITH MEDIAL  MENISCAL REPAIR;  Surgeon: Huel Cote, MD;  Location: [facility];  Service: Orthopedics;  Laterality: Right;   PILONIDAL CYST EXCISION     Patient Active Problem List   Diagnosis Date Noted   Acute medial meniscus tear of right knee 08/29/2023    PCP: Deatra James MD    REFERRING PROVIDER: Dr Huel Cote   REFERRING DIAG:  Diagnosis  [phone] (ICD-10-CM) - Acute medial meniscus tear of right knee, initial encounter   Days since surgery: 70   THERAPY DIAG:  Acute pain of right knee  Localized edema  Other abnormalities of gait and mobility  Stiffness of right knee, not elsewhere classified  Rationale for Evaluation and Treatment: Rehabilitation  ONSET DATE: 08/29/2023  SUBJECTIVE:   SUBJECTIVE STATEMENT: Pt reports increased pain of 2-3/10 today in R knee. "I did a lot of work outside this weekend." He reports it was swollen on Saturday.  Eval:The patient was stepping up on a tractor when he felt a pop. He was found to have a meniscal root tear He had a repair on 08/29/2023.  At this time the patient is in an immobilizer.  He is touchdown weightbearing.  He has been compliant with touchdown  weightbearing.  He is ambulating with crutches.  His pain is well-controlled.  PERTINENT HISTORY: DMII PAIN:  Are you having pain? No: NPRS scale: 2-3/10 at worst  Pain location: right knee  Pain description: aching   Aggravating factors: movement  Relieving factors: rest  PRECAUTIONS: Knee  RED FLAGS: None   WEIGHT BEARING RESTRICTIONS: Yes TDWB for 2 weeks   FALLS:  Has patient fallen in last 6 months? No  LIVING ENVIRONMENT: 10 steps into the house   OCCUPATION:  Gaffer   Hobbies:  Hunting/ fishing    PLOF: Independent  PATIENT GOALS:   Get the knee back to how it was  Walk on treadmill for 30-68minutes    OBJECTIVE:   PATIENT SURVEYS:  FOTO    COGNITION: Overall cognitive status: Within functional limits for tasks assessed     SENSATION: WFL  PALPATION: No significant tenderness to palpation   LOWER EXTREMITY ROM:  Passive ROM Right eval Left eval Left 10/8 10/22   Hip flexion      Hip extension      Hip abduction      Hip adduction      Hip internal rotation      Hip external rotation      Knee flexion  83 no pain or end feel. No pushed 2nd  to protocol  122deg   Knee extension  -3 0   Ankle dorsiflexion      Ankle plantarflexion      Ankle inversion      Ankle eversion       (Blank rows = not tested)  LOWER EXTREMITY MMT:  MMT Right eval Left eval  Hip flexion    Hip extension    Hip abduction    Hip adduction    Hip internal rotation    Hip external rotation    Knee flexion    Knee extension    Ankle dorsiflexion    Ankle plantarflexion    Ankle inversion    Ankle eversion     (Blank rows = not tested) Bot tested 2nd to recent surgry    GAIT: Antalgic  TODAY'S TREATMENT:                                                                                                                              DATE:  Access Code: [email or web address]  11/19 Recumbent bike L3 PROM knee flexion/extension STM to medial  distal HS Static cupping to medial distal HS  SLR 3x10 4 lbs  SL SLR 3x10 4 lbs   Leg press (black) 40 lbs 2x10  LAQ 10lbs 2x10   11/14 Recumbent bike L3 PROM knee flexion/extension SLR 3x10 4 lbs  SL SLR 3x10 4 lbs   Leg press 90 lbs 1x10 100 2x10   Knee extension machine 3x10 20 lbs bil  Walking lunges at rail x2 laps Step up 8" x20  Step down 6 inch 2x10  Staggered sit to stands 2x10 (to elevated plinth)  11/12 SLR 3x10 4 lbs  SL SLR 3x10 4 lbs   LAQ 5lbs 5" hold 1x10 discussed trying knee extension machine   Leg press 90 lbs 2x10 100 x10   LF Hip abduction 3x15 70 lbs  lbs   Knee extension machine 3x12 12 lbs   Step onto air-ex: x20   Step down 4 inch 2x10    11/5 SLR 3x10 4 lbs  SL SLR 3x10 4 lbs   LAQ 5lbs 5" hold 2x10  Leg press 90 lbs 3x10  LF Hip abduction 3x15 70 lbs  lbs   Step onto air-ex fwd and lateral x20       10/31  Nu-step 5 min L5   SLR 3x10 3 lbs  SL SLR 3x10 3 lbs   SAQ 3x10 4 lbs , 5" hold  LAQ 5lbs 5" hold 2x10  Step up 4 inch 2x12  Squats (<90deg) 2x10  Leg press 90 lbs 3x10  LF Hip abduction 3x15 70 lbs  lbs    PATIENT EDUCATION:  Education details: HEP , symptom management  Person educated: Patient Education method: Explanation, Demonstration, Tactile cues, Verbal cues, and Handouts Education comprehension: verbalized understanding, returned demonstration, verbal cues required, tactile cues required, and needs further education  HOME EXERCISE PROGRAM: [email or web address]  ASSESSMENT:  CLINICAL IMPRESSION:  Increased tenderness and tightness in medial distal HS today. Spent time on STM to this area and trialed static cupping to reduce tightness here. Regressed with closed chain activity due to discomfort level today. He denied c/o with increased resistance of leg press.   OBJECTIVE IMPAIRMENTS: Abnormal gait, decreased activity tolerance, decreased knowledge of use of DME, decreased mobility, difficulty  walking, decreased ROM, decreased strength, impaired sensation, and pain.   ACTIVITY LIMITATIONS: carrying, lifting, sitting, standing, squatting, stairs, transfers, bed mobility, bathing, toileting, and locomotion level  PARTICIPATION LIMITATIONS:   PERSONAL FACTORS: 1 comorbidity: DMII  are also affecting patient's functional outcome.   REHAB POTENTIAL: Excellent  CLINICAL DECISION MAKING: Stable/uncomplicated  EVALUATION COMPLEXITY: Low   GOALS: Goals reviewed with patient? Yes  SHORT TERM GOALS: Target date: 09/29/2023   Patient will increase passive knee flexion to 120 degrees Baseline: Goal status: Achieved 10/24  2.  Patient will demonstrate full knee extension. Baseline:  Goal status: Achieved 10/24  3.  Patient will ambulate 500 feet without crutches without increased pain Baseline:  Goal status: Not using crutches  4.  Patient will wean out of brace per MD recommendation Baseline:  Goal status: Using small brace 10/24   LONG TERM GOALS: Target date: 11/26/2023    Patient will ambulate community distances without pain Baseline:  Goal status: INITIAL  2.  Patient will go up and down 8 steps with reciprocal gait pattern Baseline:  Goal status: INITIAL  3.  Patient will have full exercise program Baseline:  Goal status: INITIAL   PLAN:  PT FREQUENCY: 2x/week  PT DURATION: 12 weeks  PLANNED INTERVENTIONS: Therapeutic exercises, Therapeutic activity, Neuromuscular re-education, Balance training, Gait training, Patient/Family education, Self Care, Joint mobilization, Stair training, DME instructions, Aquatic Therapy, Dry Needling, Electrical stimulation, Cryotherapy, Moist heat, Taping, Manual therapy, and Re-evaluation.   PLAN FOR NEXT SESSION: Begin per inside-out meniscal repair protocol   Donnel Saxon Khaiden Segreto, PTA 11/07/2023, 9:50 AM

## 2023-11-20 ENCOUNTER — Ambulatory Visit (HOSPITAL_BASED_OUTPATIENT_CLINIC_OR_DEPARTMENT_OTHER): Payer: 59 | Attending: Orthopaedic Surgery | Admitting: Physical Therapy

## 2023-11-20 DIAGNOSIS — R2689 Other abnormalities of gait and mobility: Secondary | ICD-10-CM | POA: Diagnosis present

## 2023-11-20 DIAGNOSIS — M25561 Pain in right knee: Secondary | ICD-10-CM | POA: Diagnosis present

## 2023-11-20 DIAGNOSIS — M25661 Stiffness of right knee, not elsewhere classified: Secondary | ICD-10-CM | POA: Insufficient documentation

## 2023-11-20 DIAGNOSIS — R600 Localized edema: Secondary | ICD-10-CM | POA: Diagnosis present

## 2023-11-20 NOTE — Therapy (Signed)
OUTPATIENT PHYSICAL THERAPY LOWER EXTREMITY TREATMENT/Discharge    Patient Name: Alvin Hodges MRN: [id number] DOB:[birth year], 62 y.o., male Today's Date: 11/20/2023  END OF SESSION:  PT End of Session - 11/20/23 0942     Visit Number 16    Number of Visits 24    Date for PT Re-Evaluation 11/24/23    PT Start Time 0930    PT Stop Time 1012    PT Time Calculation (min) 42 min    Activity Tolerance Patient tolerated treatment well    Behavior During Therapy WFL for tasks assessed/performed                     Past Medical History:  Diagnosis Date   DM (diabetes mellitus) (HCC)    Past Surgical History:  Procedure Laterality Date   APPENDECTOMY     KNEE ARTHROSCOPY WITH MENISCAL REPAIR Right 08/29/2023   Procedure: RIGHT KNEE ARTHROSCOPY WITH MEDIAL  MENISCAL REPAIR;  Surgeon: Huel Cote, MD;  Location: [facility];  Service: Orthopedics;  Laterality: Right;   PILONIDAL CYST EXCISION     Patient Active Problem List   Diagnosis Date Noted   Acute medial meniscus tear of right knee 08/29/2023    PCP: Deatra James MD    REFERRING PROVIDER: Dr Huel Cote   REFERRING DIAG:  Diagnosis  [phone] (ICD-10-CM) - Acute medial meniscus tear of right knee, initial encounter   Days since surgery: 83   THERAPY DIAG:  Acute pain of right knee  Localized edema  Other abnormalities of gait and mobility  Stiffness of right knee, not elsewhere classified  Rationale for Evaluation and Treatment: Rehabilitation  ONSET DATE: 08/29/2023  SUBJECTIVE:   SUBJECTIVE STATEMENT: Pt reports increased pain of 2-3/10 today in R knee. "I did a lot of work outside this weekend." He reports it was swollen on Saturday.  Eval:The patient was stepping up on a tractor when he felt a pop. He was found to have a meniscal root tear He had a repair on 08/29/2023.  At this time the patient is in an immobilizer.  He is touchdown weightbearing.  He has been compliant with  touchdown weightbearing.  He is ambulating with crutches.  His pain is well-controlled.  PERTINENT HISTORY: DMII PAIN:  Are you having pain? No: NPRS scale: 2-3/10 at worst  Pain location: right knee  Pain description: aching   Aggravating factors: movement  Relieving factors: rest  PRECAUTIONS: Knee  RED FLAGS: None   WEIGHT BEARING RESTRICTIONS: Yes TDWB for 2 weeks   FALLS:  Has patient fallen in last 6 months? No  LIVING ENVIRONMENT: 10 steps into the house   OCCUPATION:  Gaffer   Hobbies:  Hunting/ fishing    PLOF: Independent  PATIENT GOALS:   Get the knee back to how it was  Walk on treadmill for 30-70minutes    OBJECTIVE:   PATIENT SURVEYS:  FOTO    COGNITION: Overall cognitive status: Within functional limits for tasks assessed     SENSATION: WFL  PALPATION: No significant tenderness to palpation   LOWER EXTREMITY ROM:  Passive ROM Right eval Left eval Left 10/8 10/22   Hip flexion      Hip extension      Hip abduction      Hip adduction      Hip internal rotation      Hip external rotation      Knee flexion  83 no pain or end feel. No pushed  2nd to protocol  122deg   Knee extension  -3 0   Ankle dorsiflexion      Ankle plantarflexion      Ankle inversion      Ankle eversion       (Blank rows = not tested)  LOWER EXTREMITY MMT:  MMT Right eval Left eval  Hip flexion 42.2 47.0  Hip extension    Hip abduction 78.7 77.5  Hip adduction    Hip internal rotation    Hip external rotation    Knee flexion    Knee extension 36.5 60.7  Ankle dorsiflexion    Ankle plantarflexion    Ankle inversion    Ankle eversion     (Blank rows = not tested) Bot tested 2nd to recent surgry    GAIT: Antalgic  TODAY'S TREATMENT:                                                                                                                              DATE:  Access Code: [email or web address] 12/2  Reviewed tests and  measures  Reviewed goals with patient   Reviewed final HEP.   Reviewed squat technique 3x10      11/19 Recumbent bike L3 PROM knee flexion/extension STM to medial distal HS Static cupping to medial distal HS  SLR 3x10 4 lbs  SL SLR 3x10 4 lbs   Leg press (black) 40 lbs 2x10  LAQ 10lbs 2x10   11/14 Recumbent bike L3 PROM knee flexion/extension SLR 3x10 4 lbs  SL SLR 3x10 4 lbs   Leg press 90 lbs 1x10 100 2x10   Knee extension machine 3x10 20 lbs bil  Walking lunges at rail x2 laps Step up 8" x20  Step down 6 inch 2x10  Staggered sit to stands 2x10 (to elevated plinth)  11/12 SLR 3x10 4 lbs  SL SLR 3x10 4 lbs   LAQ 5lbs 5" hold 1x10 discussed trying knee extension machine   Leg press 90 lbs 2x10 100 x10   LF Hip abduction 3x15 70 lbs  lbs   Knee extension machine 3x12 12 lbs   Step onto air-ex: x20   Step down 4 inch 2x10    11/5 SLR 3x10 4 lbs  SL SLR 3x10 4 lbs   LAQ 5lbs 5" hold 2x10  Leg press 90 lbs 3x10  LF Hip abduction 3x15 70 lbs  lbs   Step onto air-ex fwd and lateral x20       10/31  Nu-step 5 min L5   SLR 3x10 3 lbs  SL SLR 3x10 3 lbs   SAQ 3x10 4 lbs , 5" hold  LAQ 5lbs 5" hold 2x10  Step up 4 inch 2x12  Squats (<90deg) 2x10  Leg press 90 lbs 3x10  LF Hip abduction 3x15 70 lbs  lbs    PATIENT EDUCATION:  Education details: HEP , symptom management  Person educated: Patient Education method: Explanation, Demonstration, Tactile cues,  Verbal cues, and Handouts Education comprehension: verbalized understanding, returned demonstration, verbal cues required, tactile cues required, and needs further education  HOME EXERCISE PROGRAM: [email or web address]  ASSESSMENT:  CLINICAL IMPRESSION:  The patient has made great progress. He has met his FOTO goal and his functional goals. He continues to have a quad deficit. We reviewed exercises that he can do at home and in the gym. He will D/C to HEP at this time. See  below for goal specific progress  OBJECTIVE IMPAIRMENTS: Abnormal gait, decreased activity tolerance, decreased knowledge of use of DME, decreased mobility, difficulty walking, decreased ROM, decreased strength, impaired sensation, and pain.   ACTIVITY LIMITATIONS: carrying, lifting, sitting, standing, squatting, stairs, transfers, bed mobility, bathing, toileting, and locomotion level  PARTICIPATION LIMITATIONS:   PERSONAL FACTORS: 1 comorbidity: DMII  are also affecting patient's functional outcome.   REHAB POTENTIAL: Excellent  CLINICAL DECISION MAKING: Stable/uncomplicated  EVALUATION COMPLEXITY: Low   GOALS: Goals reviewed with patient? Yes  SHORT TERM GOALS: Target date: 09/29/2023   Patient will increase passive knee flexion to 120 degrees Baseline: Goal status: Achieved 10/24  2.  Patient will demonstrate full knee extension. Baseline:  Goal status: Achieved 10/24  3.  Patient will ambulate 500 feet without crutches without increased pain Baseline:  Goal status: Not using crutches  4.  Patient will wean out of brace per MD recommendation Baseline:  Goal status: Using small brace 10/24   LONG TERM GOALS: Target date: 11/26/2023    Patient will ambulate community distances without pain Baseline:  Goal status: achieved 12/2   2.  Patient will go up and down 8 steps with reciprocal gait pattern Baseline:  Goal status:can feel it but can do it achieved 12/2  3.  Patient will have full exercise program Baseline:  Goal status: has full program 12/2   PLAN:  PT FREQUENCY: 2x/week  PT DURATION: 12 weeks  PLANNED INTERVENTIONS: Therapeutic exercises, Therapeutic activity, Neuromuscular re-education, Balance training, Gait training, Patient/Family education, Self Care, Joint mobilization, Stair training, DME instructions, Aquatic Therapy, Dry Needling, Electrical stimulation, Cryotherapy, Moist heat, Taping, Manual therapy, and Re-evaluation.   PLAN FOR  NEXT SESSION: Begin per inside-out meniscal repair protocol   Dessie Coma, PT 11/20/2023, 9:47 AM

## 2023-11-22 ENCOUNTER — Ambulatory Visit (INDEPENDENT_AMBULATORY_CARE_PROVIDER_SITE_OTHER): Payer: 59 | Admitting: Orthopaedic Surgery

## 2023-11-22 DIAGNOSIS — S83241A Other tear of medial meniscus, current injury, right knee, initial encounter: Secondary | ICD-10-CM

## 2023-11-22 NOTE — Progress Notes (Signed)
Post Operative Evaluation    Procedure/Date of Surgery: Right medial meniscal repair 9/10  Interval History:   Presents today for follow-up of his right knee.  He is continuing to improve.  He does occasionally have some stiffness or soreness.  Overall continuing to do quite well   PMH/PSH/Family History/Social History/Meds/Allergies:    Past Medical History:  Diagnosis Date   DM (diabetes mellitus) (HCC)    Past Surgical History:  Procedure Laterality Date   APPENDECTOMY     KNEE ARTHROSCOPY WITH MENISCAL REPAIR Right 08/29/2023   Procedure: RIGHT KNEE ARTHROSCOPY WITH MEDIAL  MENISCAL REPAIR;  Surgeon: Huel Cote, MD;  Location: [facility];  Service: Orthopedics;  Laterality: Right;   PILONIDAL CYST EXCISION     Social History   Socioeconomic History   Marital status: Married    Spouse name: Not on file   Number of children: Not on file   Years of education: Not on file   Highest education level: Not on file  Occupational History   Not on file  Tobacco Use   Smoking status: Every Day    Types: Cigarettes   Smokeless tobacco: Never  Substance and Sexual Activity   Alcohol use: Yes    Comment: rare   Drug use: Never   Sexual activity: Not on file  Other Topics Concern   Not on file  Social History Narrative   Not on file   Social Determinants of Health   Financial Resource Strain: Not on file  Food Insecurity: Not on file  Transportation Needs: Not on file  Physical Activity: Not on file  Stress: Not on file  Social Connections: Not on file   No family history on file. No Known Allergies Current Outpatient Medications  Medication Sig Dispense Refill   aspirin EC 325 MG tablet Take 1 tablet (325 mg total) by mouth daily. 14 tablet 0   Dapagliflozin Propanediol (FARXIGA PO) Take by mouth.     metformin (FORTAMET) 1000 MG (OSM) 24 hr tablet Take 1,000 mg by mouth daily with breakfast.     oxyCODONE  (ROXICODONE) 5 MG immediate release tablet Take 1 tablet (5 mg total) by mouth every 4 (four) hours as needed for severe pain or breakthrough pain. 10 tablet 0   Semaglutide, 2 MG/DOSE, (OZEMPIC, 2 MG/DOSE,) 8 MG/3ML SOPN Inject into the skin.     testosterone cypionate (DEPOTESTOTERONE CYPIONATE) 100 MG/ML injection Inject into the muscle every 14 (fourteen) days. For IM use only     No current facility-administered medications for this visit.   No results found.  Review of Systems:   A ROS was performed including pertinent positives and negatives as documented in the HPI.   Musculoskeletal Exam:    There were no vitals taken for this visit.  Right incisions are well-appearing without erythema or drainage.  Range of motion is from 0 to 120 degrees.  Brace applied.  Improved quad atrophy.  No joint tenderness  Imaging:      I personally reviewed and interpreted the radiographs.   Assessment:   12-week status post right medial meniscal repair overall doing extremely well.  He continues to improve at this time.  He has minimal pain.  There are some soreness.  At this time I would like him to work and finished with  physical therapy in the next 3 months and I will plan to see him back as needed. Plan :    -Return to clinic as needed      I personally saw and evaluated the patient, and participated in the management and treatment plan.  Huel Cote, MD Attending Physician, Orthopedic Surgery  This document was dictated using Dragon voice recognition software. A reasonable attempt at proof reading has been made to minimize errors.

## 2024-04-16 ENCOUNTER — Other Ambulatory Visit: Payer: Self-pay | Admitting: Family Medicine

## 2024-04-16 DIAGNOSIS — F17210 Nicotine dependence, cigarettes, uncomplicated: Secondary | ICD-10-CM

## 2024-05-06 ENCOUNTER — Ambulatory Visit
Admission: RE | Admit: 2024-05-06 | Discharge: 2024-05-06 | Disposition: A | Discharge status: Home / Self Care | Source: Ambulatory Visit | Attending: Family Medicine | Admitting: Family Medicine

## 2024-05-06 DIAGNOSIS — F17210 Nicotine dependence, cigarettes, uncomplicated: Secondary | ICD-10-CM

## 2024-10-16 ENCOUNTER — Other Ambulatory Visit: Payer: Self-pay | Admitting: Family Medicine

## 2024-10-16 DIAGNOSIS — R911 Solitary pulmonary nodule: Secondary | ICD-10-CM

## 2024-10-21 ENCOUNTER — Encounter: Payer: Self-pay | Admitting: Radiology

## 2024-11-07 ENCOUNTER — Ambulatory Visit
Admission: RE | Admit: 2024-11-07 | Discharge: 2024-11-07 | Disposition: A | Discharge status: Home / Self Care | Source: Ambulatory Visit | Attending: Family Medicine | Admitting: Family Medicine

## 2024-11-07 DIAGNOSIS — R911 Solitary pulmonary nodule: Secondary | ICD-10-CM
